# Patient Record
Sex: Female | Born: 1945 | Race: White | Hispanic: No | Smoking: Current every day smoker
Health system: Southern US, Community
[De-identification: ages and names within clinical notes are randomized; demographics above are authoritative.]

## PROBLEM LIST (undated history)

## (undated) DIAGNOSIS — K219 Gastro-esophageal reflux disease without esophagitis: Secondary | ICD-10-CM

## (undated) DIAGNOSIS — J449 Chronic obstructive pulmonary disease, unspecified: Secondary | ICD-10-CM

## (undated) DIAGNOSIS — F419 Anxiety disorder, unspecified: Secondary | ICD-10-CM

## (undated) DIAGNOSIS — R7303 Prediabetes: Secondary | ICD-10-CM

## (undated) DIAGNOSIS — E119 Type 2 diabetes mellitus without complications: Secondary | ICD-10-CM

## (undated) DIAGNOSIS — M797 Fibromyalgia: Secondary | ICD-10-CM

## (undated) DIAGNOSIS — M810 Age-related osteoporosis without current pathological fracture: Secondary | ICD-10-CM

## (undated) DIAGNOSIS — I10 Essential (primary) hypertension: Secondary | ICD-10-CM

## (undated) DIAGNOSIS — M1990 Unspecified osteoarthritis, unspecified site: Secondary | ICD-10-CM

## (undated) DIAGNOSIS — A630 Anogenital (venereal) warts: Secondary | ICD-10-CM

## (undated) DIAGNOSIS — E785 Hyperlipidemia, unspecified: Secondary | ICD-10-CM

## (undated) DIAGNOSIS — I2510 Atherosclerotic heart disease of native coronary artery without angina pectoris: Secondary | ICD-10-CM

## (undated) DIAGNOSIS — F4310 Post-traumatic stress disorder, unspecified: Secondary | ICD-10-CM

## (undated) HISTORY — DX: Atherosclerotic heart disease of native coronary artery without angina pectoris: I25.10

## (undated) HISTORY — DX: Anxiety disorder, unspecified: F41.9

## (undated) HISTORY — DX: Hyperlipidemia, unspecified: E78.5

## (undated) HISTORY — DX: Chronic obstructive pulmonary disease, unspecified: J44.9

## (undated) HISTORY — PX: INCISIONAL HERNIA REPAIR: SHX193

## (undated) HISTORY — DX: Essential (primary) hypertension: I10

## (undated) HISTORY — DX: Gastro-esophageal reflux disease without esophagitis: K21.9

## (undated) HISTORY — DX: Unspecified osteoarthritis, unspecified site: M19.90

## (undated) HISTORY — DX: Post-traumatic stress disorder, unspecified: F43.10

## (undated) HISTORY — PX: ABDOMINAL HYSTERECTOMY: SHX81

## (undated) HISTORY — DX: Prediabetes: R73.03

## (undated) HISTORY — PX: VASCULAR SURGERY: SHX849

## (undated) HISTORY — DX: Anogenital (venereal) warts: A63.0

## (undated) HISTORY — DX: Fibromyalgia: M79.7

## (undated) HISTORY — DX: Type 2 diabetes mellitus without complications: E11.9

## (undated) HISTORY — DX: Age-related osteoporosis without current pathological fracture: M81.0

---

## 1990-10-03 HISTORY — PX: PR VEIN BYPASS GRAFT,AORTO-FEM-POP: 35551

## 1999-08-09 ENCOUNTER — Encounter: Admission: RE | Admit: 1999-08-09 | Discharge: 1999-08-09 | Payer: Self-pay

## 1999-08-09 ENCOUNTER — Encounter: Payer: Self-pay | Admitting: General Surgery

## 2004-01-27 ENCOUNTER — Other Ambulatory Visit: Admission: RE | Admit: 2004-01-27 | Discharge: 2004-01-27 | Payer: Self-pay | Admitting: Obstetrics and Gynecology

## 2004-06-23 ENCOUNTER — Ambulatory Visit: Admission: RE | Admit: 2004-06-23 | Discharge: 2004-06-23 | Payer: Self-pay | Admitting: Gynecologic Oncology

## 2004-11-15 ENCOUNTER — Other Ambulatory Visit: Admission: RE | Admit: 2004-11-15 | Discharge: 2004-11-15 | Payer: Self-pay | Admitting: Obstetrics and Gynecology

## 2005-04-06 ENCOUNTER — Other Ambulatory Visit: Admission: RE | Admit: 2005-04-06 | Discharge: 2005-04-06 | Payer: Self-pay | Admitting: Obstetrics and Gynecology

## 2005-08-10 ENCOUNTER — Other Ambulatory Visit: Admission: RE | Admit: 2005-08-10 | Discharge: 2005-08-10 | Payer: Self-pay | Admitting: Obstetrics and Gynecology

## 2006-01-11 ENCOUNTER — Other Ambulatory Visit: Admission: RE | Admit: 2006-01-11 | Discharge: 2006-01-11 | Payer: Self-pay | Admitting: Obstetrics and Gynecology

## 2006-04-03 ENCOUNTER — Encounter: Payer: Self-pay | Admitting: Vascular Surgery

## 2006-04-03 ENCOUNTER — Ambulatory Visit (HOSPITAL_COMMUNITY): Admission: RE | Admit: 2006-04-03 | Discharge: 2006-04-03 | Payer: Self-pay | Admitting: *Deleted

## 2006-07-03 ENCOUNTER — Other Ambulatory Visit: Admission: RE | Admit: 2006-07-03 | Discharge: 2006-07-03 | Payer: Self-pay | Admitting: Obstetrics and Gynecology

## 2007-04-30 ENCOUNTER — Ambulatory Visit: Payer: Self-pay | Admitting: *Deleted

## 2007-06-21 ENCOUNTER — Ambulatory Visit: Payer: Self-pay | Admitting: *Deleted

## 2007-10-04 HISTORY — PX: RENAL ARTERY STENT: SHX2321

## 2007-10-04 HISTORY — PX: APPENDECTOMY: SHX54

## 2007-10-30 ENCOUNTER — Ambulatory Visit: Payer: Self-pay | Admitting: *Deleted

## 2007-12-20 ENCOUNTER — Ambulatory Visit: Payer: Self-pay | Admitting: *Deleted

## 2008-02-21 ENCOUNTER — Ambulatory Visit: Payer: Self-pay | Admitting: *Deleted

## 2008-03-05 ENCOUNTER — Ambulatory Visit (HOSPITAL_COMMUNITY): Admission: RE | Admit: 2008-03-05 | Discharge: 2008-03-05 | Payer: Self-pay | Admitting: *Deleted

## 2008-03-05 ENCOUNTER — Ambulatory Visit: Payer: Self-pay | Admitting: *Deleted

## 2008-05-01 ENCOUNTER — Ambulatory Visit: Payer: Self-pay | Admitting: *Deleted

## 2008-06-04 ENCOUNTER — Ambulatory Visit (HOSPITAL_COMMUNITY): Admission: RE | Admit: 2008-06-04 | Discharge: 2008-06-05 | Payer: Self-pay | Admitting: *Deleted

## 2008-06-04 ENCOUNTER — Ambulatory Visit: Payer: Self-pay | Admitting: *Deleted

## 2008-06-26 ENCOUNTER — Ambulatory Visit: Payer: Self-pay | Admitting: *Deleted

## 2008-08-14 ENCOUNTER — Ambulatory Visit: Payer: Self-pay | Admitting: *Deleted

## 2009-08-25 ENCOUNTER — Ambulatory Visit: Payer: Self-pay | Admitting: Vascular Surgery

## 2010-10-05 ENCOUNTER — Ambulatory Visit
Admission: RE | Admit: 2010-10-05 | Discharge: 2010-10-05 | Payer: Self-pay | Source: Home / Self Care | Attending: Vascular Surgery | Admitting: Vascular Surgery

## 2010-10-05 ENCOUNTER — Ambulatory Visit: Admit: 2010-10-05 | Payer: Self-pay | Admitting: Vascular Surgery

## 2010-10-26 ENCOUNTER — Ambulatory Visit
Admission: RE | Admit: 2010-10-26 | Discharge: 2010-10-26 | Payer: Self-pay | Source: Home / Self Care | Attending: Vascular Surgery | Admitting: Vascular Surgery

## 2010-10-27 NOTE — Assessment & Plan Note (Signed)
OFFICE VISIT  Lindsey, [name] DOB:  [birth year]                                       10/26/2010 [id number]  The patient returns today for further followup regarding her carotid occlusive disease and left renal artery stent.  She was last seen in November 2010 and she remains asymptomatic relative to carotid disease. She denies any paresthesias, aphasia, amaurosis fugax, diplopia, blurred vision, syncope or other neurologic symptoms.  Her blood pressure has been stable as has her kidney function according to her.  She has had a stent placed by Dr. Amedeo Plenty in 2009 in her left renal artery, aortobifemoral bypass by Dr. Scot Dock in 1992.  CHRONIC MEDICAL PROBLEMS: 1. Diabetes. 2. Hypertension. 3. Hyperlipidemia. 4. COPD. 5. Negative for coronary artery disease or stroke.  SOCIAL HISTORY:  She is single, disabled.  Smokes 1 to 1-1/2 pack cigarettes per day, does not use alcohol.  FAMILY HISTORY:  Positive for stroke and diabetes.  Negative coronary artery disease.  REVIEW OF SYSTEMS:  Positive for productive cough, bronchitis, asthma, wheezing, arthritis, joint pain, muscle pain, dyspnea on exertion, anxiety.  All other systems are negative on complete review of systems.  PHYSICAL EXAMINATION:  Vital signs:  Blood pressure 163/73, heart rate 64, respirations 20.  General:  She is a chronically ill-appearing female in no apparent distress, alert, oriented x3.  HEENT:  Normal for age.  EOMs intact.  Lungs:  Clear to auscultation.  Bilateral rhonchi present.  Cardiovascular:  Regular rhythm, no murmurs.  Carotid pulses 3+ with bilateral bruits.  Abdomen:  Soft, nontender with bruits in both bilateral upper abdomen.  Musculoskeletal:  Free of major deformities. Lower extremities:  Reveals 3+ femoral pulses bilaterally.  I ordered a carotid duplex exam and a renal scan.  She has no flow reduction in the left carotid and mild flow reduction in the  right carotid which is stable.  She does have visualization of her left renal artery today which was not seen clearly on her last study a year ago and the right renal artery has some mild narrowing in the midportion.  I reassured her regarding these findings.  We will see her back in 1 year for continued followup unless she develops new symptoms in the interim.    Lindsey [name] Lindsey [name], M.D. Electronically Signed  JDL/MEDQ  D:  10/26/2010  T:  10/27/2010  Job:  [id number]

## 2011-02-15 NOTE — Assessment & Plan Note (Signed)
OFFICE VISIT   Lindsey [name], Lindsey [name]  DOB:  1946/06/11                                       06/26/2008  [id number]   The patient underwent left renal arteriogram with PTA and stenting on  06/04/2008.  This was carried out via a right femoral approach.   She noted some swelling in her right groin and presented to the office  with this complaint.  She states this has now resolved.  No complaints  otherwise.   BP 161/71, pulse 76 per minute, respirations 18 per minute.  Abdomen  soft, nontender.  No masses or organomegaly.  Right groin incision is  unremarkable.   Doing well following left renal stenting.  Plan followup with carotid  duplex and renal stenting per protocol.   Dorothea Glassman, M.D.  Electronically Signed   PGH/MEDQ  D:  06/26/2008  T:  06/28/2008  Job:  [id number]

## 2011-02-15 NOTE — Assessment & Plan Note (Signed)
OFFICE VISIT   CYNNAMON, KENTER  DOB:  Jun 28, 1946                                       06/21/2007  [id number]   Patient return to the office today for continued surveillance of her  carotid disease.  She has remained asymptomatic.  Denies sensory, motor,  or visual deficit.  No speech or swallowing problems.  No gait  abnormality.   Her carotid Doppler evaluation reveals a 60-79% right ICA stenosis with  velocities of 201/37 cm/sec.  The left ICA reveals minimal disease.  Velocities 110/27 cm/sec.  She has biphasic brachial Doppler flow and  antegrade vertebral flow bilaterally.   She appears generally well.  Alert and oriented.  Cranial nerves are  intact.  Strength is equal bilaterally.  Heart sounds are normal without  murmurs.  Chest is clear with equal air entry bilaterally.  Right  carotid bruit audible.   Patient remains asymptomatic without significant change in carotid  artery disease over the past six months.  We will plan continued  surveillance in six months with repeat Doppler evaluation.   Dorothea Glassman, M.D.  Electronically Signed   PGH/MEDQ  D:  06/21/2007  T:  06/22/2007  Job:  [id number]   cc:   Charletta Cousin

## 2011-02-15 NOTE — Discharge Summary (Signed)
Lindsey [name], Lindsey [name]                ACCOUNT NO.:  [id number]   MEDICAL RECORD NO.:  [id number]          PATIENT TYPE:  OIB   LOCATION:  [id number]                         FACILITY:  [facility]   PHYSICIAN:  Dorothea Glassman, M.D.    DATE OF BIRTH:  Oct 23, 1945   DATE OF ADMISSION:  06/04/2008  DATE OF DISCHARGE:  06/05/2008                               DISCHARGE SUMMARY   DISCHARGE DIAGNOSIS:  Left renal arterial stenosis.   PROCEDURE PERFORMED:  Left renal arteriogram with placement of left  renal artery stent.   COMPLICATIONS:  None.   DISCHARGE MEDICATIONS:  She was instructed to resume all preoperative  medications consisting of:  1. Diovan 320 mg p.o. daily.  2. Zetia 10 mg p.o. daily.  3. Aspirin 325 mg p.o. daily.  4. Xanax 0.5 mg p.o. b.i.d.  5. Multivitamin with iron p.o. daily.  6. Crestor 10 mg p.o. daily.   DISPOSITION:  She is being discharged home in stable condition.  She is  to return to see Dr. Amedeo Plenty per his instructions.   BRIEF IDENTIFYING STATEMENT:  For complete details, please refer to  typed history and physical.  Briefly, this very pleasant 65 year old  woman with renal artery stenosis was evaluated by Dr. Amedeo Plenty.  He  recommended left renal arteriogram with placement of a left renal stent.  She was informed of the risks and benefits of the procedure and after  careful consideration, elected to proceed with surgery.   HOSPITAL COURSE:  She was brought in through the same-day surgery and  underwent the aforementioned left renal arteriogram with placement of  the left renal arterial stent.  For complete details, please refer the  typed operative report.  The procedure was without complications.  She  was returned to a surgical convalescent floor, and she was observed  overnight.  The following day, she was discharged home in stable  condition.      Chad Cordial, PA      P. Drucie Opitz, M.D.  Electronically Signed    KEL/MEDQ  D:  06/30/2008  T:   06/30/2008  Job:  [id number]

## 2011-02-15 NOTE — Procedures (Signed)
CAROTID DUPLEX EXAM   INDICATION:  Follow up known carotid artery disease.   HISTORY:  Diabetes:  No.  Cardiac:  COPD.  Hypertension:  Yes.  Smoking:  Yes.  Previous Surgery:  Left renal stent.  CV History:  Amaurosis Fugax No, Paresthesias No, Hemiparesis No.                                       RIGHT             LEFT  Brachial systolic pressure:  Brachial Doppler waveforms:  Vertebral direction of flow:        Antegrade         Antegrade  DUPLEX VELOCITIES (cm/sec)  CCA peak systolic                   120               0000000  ECA peak systolic                   186               0000000  ICA peak systolic                   236               [id number]  ICA end diastolic                   49                35  PLAQUE MORPHOLOGY:                  Mixed             Calcific  PLAQUE AMOUNT:                      Moderate          Mild  PLAQUE LOCATION:                    ICA               ICA   IMPRESSION:  1. 60-79% stenosis of the right internal carotid artery.  2. 40-59% stenosis of the left internal carotid (very low range, end      of range).  3. Antegrade flow in bilateral vertebrals.   ___________________________________________  Nelda Severe Kellie Simmering, M.D.   CB/MEDQ  D:  08/26/2009  T:  08/26/2009  Job:  [id number]

## 2011-02-15 NOTE — Assessment & Plan Note (Signed)
OFFICE VISIT   Lindsey [name], Lindsey [name]  DOB:  Sep 09, 1946                                       05/01/2008  [id number]   The patient has undergone an arteriogram which does reveal a severe left  renal artery stenosis, atrophy of the left kidney, a tubular stenosis of  the infrarenal aorta, patent aortobifemoral bypass and intact infrarenal  runoff.   Of concern is a severe stenosis of the left renal artery verified by  duplex scan.  She does have atrophy of the left kidney.   PHYSICAL EXAMINATION:  Her blood pressure is 161/73, pulse is 62 per  minute.  She is alert, oriented, no acute distress.  Abdomen:  Soft,  nontender.  Central abdominal bruit.  Intact femoral pulses bilaterally.   Due to the atrophy of her left kidney and associated severe stenosis, I  do think it would be in the patient's best interest to go ahead and  stent this.  I think this will be difficult to get to from the groin and  therefore will plan to access this through the left arm.  Scheduled for  05/21/2008 at [facility].   Dorothea Glassman, M.D.  Electronically Signed   PGH/MEDQ  D:  05/01/2008  T:  05/02/2008  Job:  [id number]   cc:   Clayborne Dana, MD

## 2011-02-15 NOTE — Assessment & Plan Note (Signed)
OFFICE VISIT   Lindsey [name], Lindsey [name]  DOB:  [birth year]                                       02/21/2008  [id number]   The patient was sent to the office today for an evaluation of abnormal  MR angiogram of the abdomen.  She has been complaining of some diffuse  or general moderate claudication symptoms bilaterally.  Also a history  of hypertension.  MR angiogram of the abdomen reveals infrarenal aortic  stenosis at the site of aortobi-iliac graft.  Also a severe left renal  artery stenosis with some atrophy of the left kidney.   She has a history remarkable for aortobi-iliac grafting with aortic  endarterectomy carried out in 1992 by Dr. Nicole Cella.  She does continue  to smoke a pack of cigarettes daily.   FURTHER MEDICAL HISTORY:  Includes COPD, osteoarthritis, hypertension,  hyperlipidemia, carotid stenosis, degenerative joint disease.   PAST SURGICAL HISTORY:  Surgical history includes aortobi-iliac bypass.  She has had an abdominal hysterectomy with ventral hernia repair with  mesh.   She continues to smoke one pack of cigarettes daily.   PHYSICAL EXAMINATION:  Vital signs:  Blood pressure is 151/68, pulse 67  per minute, respirations are 18 per minute.  Abdomen:  Abdomen is soft  and nontender.  No evidence of recurrent hernia.  Well-healed midline  incision.  No masses or organomegaly.  Vascular:  Femoral pulses are 1-  2+ bilaterally.  2+ right dorsalis pedis and 1+ left posterior tibial  pulse.   The patient shows evidence of severe left renal artery stenosis with  some atrophy of the kidney and also evidence of stenosis in her aortobi-  iliac graft.   We will plan to go ahead with diagnostic arteriography and possible  intervention if indicated, scheduled for June 3 at [facility].   Dorothea Glassman, M.D.  Electronically Signed   PGH/MEDQ  D:  02/21/2008  T:  02/22/2008  Job:  [id number]   cc:   Glendon Axe, Dr

## 2011-02-15 NOTE — Assessment & Plan Note (Signed)
OFFICE VISIT   Lindsey [name], Lindsey [name]  DOB:  [birth year]                                       08/25/2009  [id number]   The patient is a patient of Dr. Amedeo Plenty who returns today for annual  carotid occlusive disease followup.  She denies any symptoms of  hemiparesis, aphasia, amaurosis fugax, diplopia, blurred vision, syncope  or other neurologic problems.  She also states her blood pressure has  been stable having previously had a left renal stent placed by Dr. Amedeo Plenty  in 2009.  Her renal function apparently is stable as well although we do  not have any recent reports of that.  She denies any severe claudication  symptoms.   STABLE CHRONIC PROBLEMS:  1. Include diabetes mellitus type 2.  2. Hypertension.  3. Hyperlipidemia.  4. COPD.   SOCIAL HISTORY:  She is single, has no children, is retired.  Smokes 1  to 1-1/2 packs of cigarettes per day and has done so for 45+ years.  Does not use alcohol.   REVIEW OF SYSTEMS:  Positive for dyspnea on exertion and at rest,  occasional productive chronic bronchitis and productive cough, asthma,  wheezing, dizziness, arthritis, joint pain, anxiety.  All systems  negative.   PHYSICAL EXAMINATION:  Vital signs:  Blood pressure 155/78, heart rate  52, respirations 14, temperature 98.  General:  She is a female patient  who is alert and oriented x3.  She is thin and well-nourished.  Neck:  Carotid pulses are 3+, no bruits.  HEENT:  Exam unremarkable.  Neurological:  Normal.  Chest:  Clear to auscultation.  Cardiovascular:  Regular rhythm.  No murmurs.  Abdomen:  Soft, nontender.  There is a  bruit in the left upper quadrant.  She has 3+ femoral pulses bilaterally  with well-perfused lower extremities.   I ordered a carotid duplex exam as well as an abdominal renal scan  today.  She has a mild left internal carotid stenosis and a moderate  right internal carotid stenosis which are unchanged from previous  studies.   The right renal artery is widely patent.  Left renal artery  was not well visualized.   I am not certain if her left renal artery is patent or not but she seems  to be stable from a standpoint of renal function and blood pressure.  I  do hear a bruit in that area and possibly there is still flow getting to  the left renal artery but she does have good right renal artery flow.  Apparently it was a very difficult technical procedure when Dr. Amedeo Plenty  performed it so we will not pursue the left renal stenting any further.  She will return in 1 year for followup carotid duplex exam on the  protocol at that time.   Nelda Severe Kellie Simmering, M.D.  Electronically Signed   JDL/MEDQ  D:  08/25/2009  T:  08/26/2009  Job:  [id number]

## 2011-02-15 NOTE — Assessment & Plan Note (Signed)
OFFICE VISIT   DOVA, BACOTE  DOB:  04/08/1946                                       08/14/2008  [id number]   The patient returned to the office today with a carotid Doppler  evaluation.  She has known moderate bilateral internal carotid artery  stenosis.   Free of any symptoms, denies sensory, motor or visual deficit.   Carotid Doppler reveals mild to moderate bilateral carotid stenosis,  right greater than left, in the 40-59% range.  Antegrade vertebral flow  present bilaterally.   The patient appears generally well.  Right carotid bruit audible.  Cranial nerves intact.  Strength equal bilaterally.  Reflexes 2+.  BP is  160/70, pulse is 65 per minute.   Moderate asymptomatic bilateral carotid stenosis identified.  Continue  follow-up in 1 year with carotid Doppler.   Dorothea Glassman, M.D.  Electronically Signed   PGH/MEDQ  D:  08/14/2008  T:  08/15/2008  Job:  [id number]

## 2011-02-15 NOTE — Procedures (Signed)
RENAL ARTERY DUPLEX EVALUATION   INDICATION:  Follow up renal exam, left renal stent.   HISTORY:  Diabetes:  No.  Cardiac:  COPD.  Hypertension:  Yes.  Smoking:  Yes.   RENAL ARTERY DUPLEX FINDINGS:  Aorta-Proximal:  87 cm/s  Aorta-Mid:  529 cm/s  Aorta-Distal:  Not visualized  Celiac Artery Origin:  226 cm/s  SMA Origin:  214 cm/s                                    RIGHT               LEFT  Renal Artery Origin:             92 cm/s             Not visualized  Renal Artery Proximal:           221 cm/s            Not visualized  Renal Artery Mid:                                    Not visualized  Renal Artery Distal:             146 cm/s  Hilar Acceleration Time (AT):    0.89 m/s2           0.77 m/s2  Renal-Aortic Ratio (RAR):        2.5  Kidney Size:                     10.2 cm             8.9 cm  End Diastolic Ratio (EDR):  Resistive Index (RI):            0.80                0.74   IMPRESSION:  1. Increased velocities in the aorta.  2. Right renal artery <60% stenosis.  3. Left renal artery not visualized, attempted by 2 technicians.   ___________________________________________  Nelda Severe. Kellie Simmering, M.D.   CB/MEDQ  D:  08/26/2009  T:  08/26/2009  Job:  [id number]

## 2011-02-15 NOTE — Procedures (Signed)
CAROTID DUPLEX EXAM   INDICATION:  Follow up carotid artery disease.   HISTORY:  Diabetes:  No.  Cardiac:  COPD.  Hypertension:  Yes.  Smoking:  Yes.  Previous Surgery:  No carotid surgery.  CV History:  Currently asymptomatic.  Amaurosis Fugax No, Paresthesias No, Hemiparesis No                                       RIGHT             LEFT  Brachial systolic pressure:         142               144  Brachial Doppler waveforms:         Normal            Normal  Vertebral direction of flow:        Antegrade         Antegrade  DUPLEX VELOCITIES (cm/sec)  CCA peak systolic                   M=87/D=202        [id number]  ECA peak systolic                   172               97  ICA peak systolic                   140               76  ICA end diastolic                   32                19  PLAQUE MORPHOLOGY:                  Mixed             Mixed  PLAQUE AMOUNT:                      Moderate          Mild  PLAQUE LOCATION:                    ICA/ECA/CCA       ICA/ECA/CCA   IMPRESSION:  1. Doppler velocities suggest a 40-59% stenosis of the right proximal      to mid internal carotid artery with an increased velocity of the      right distal common carotid artery near the bifurcation noted.  2. No hemodynamically significant stenosis of the left internal      carotid artery with mild plaque formation as described above.  3. No significant change in Doppler velocities noted when compared to      the previous exam on 08/25/2009.   ___________________________________________  Nelda Severe. Kellie Simmering, M.D.   CH/MEDQ  D:  10/06/2010  T:  10/06/2010  Job:  [id number]

## 2011-02-15 NOTE — Op Note (Signed)
NAMEAASIYA, ROLLINGS                ACCOUNT NO.:  [id number]   MEDICAL RECORD NO.:  [id number]          PATIENT TYPE:  AMB   LOCATION:  SDS                          FACILITY:  [facility]   PHYSICIAN:  Dorothea Glassman, M.D.    DATE OF BIRTH:  [birth year]   DATE OF PROCEDURE:  03/05/2008  DATE OF DISCHARGE:                               OPERATIVE REPORT   DIAGNOSIS:  Bilateral lower extremity claudication.   PROCEDURE:  Abdominal aortogram with bilateral lower extremity runoff  arteriography.   ACCESS:  Right common femoral artery 5-French sheath.   CONTRAST:  137 mL Visipaque.   COMPLICATIONS:  None apparent.   CLINICAL NOTE:  Lindsey [name] is a 65 year old female who has a history  of aortobiiliac bypass carried out by Dr. Scot Dock in 1992.  She  continues to smoke a pack of cigarettes daily.  She has now developed  limiting claudication bilaterally in the lower extremities.  Evaluation  reveals evidence of stenosis of the aorta, proximal to the previous  bypass graft.  Brought to the cath lab at this time for further workup  with arteriography and possible percutaneous intervention.   PROCEDURE NOTE:  The patient brought to cath lab in stable condition.  Placed in supine position.  Both groins prepped and draped in sterile  fashion.   Skin and subcutaneous tissue, right groin instilled with 1% Xylocaine.  An 18-gauge needle induced in the right common femoral artery.  A 0.035  Magic Torque guidewire was advanced through the needle into the right  common iliac artery.  This was then advanced up into the aortobi-iliac  graft and into the abdominal aorta.  The needle removed and a 5-French  sheath advanced over guidewire.  The pigtail catheter advanced over the  guidewire to the juxtarenal aorta.   Standard AP abdominal aortogram obtained.  This revealed a single renal  arteries bilaterally.  Right renal artery was widely patent.  Left renal  artery did reveal a high-grade stenosis.   The left kidney noted to be  smaller than the right.  There was a long tubular stenosis of moderate  severity of the endarterectomized infrarenal aorta.  The aortobi-iliac  graft was widely patent.   Bilateral oblique pelvic images revealed the aortobiiliac graft and  anastomosis to be widely patent.  Retrograde flow in the external iliac  arteries reconstituted the hypogastric arteries bilaterally.  The  external iliac arteries were patent.  There was a moderate irregular  stenosis of the right common iliac artery.   Lower extremity runoff arteriography revealed patent superficial  femoral, profunda, popliteal, and tibial arteries bilaterally.   This completed the arteriogram procedure.  The guidewire reinserted,  pigtail catheter removed.  Right femoral sheath removed.  No apparent  complications.   IMPRESSION:  1. Severe left renal artery stenosis.  2. Small moderate tubular stenosis of the infrarenal aorta.  3. Patent aortobi-iliac bypass.  4. Right common femoral artery stenosis.  5. Intact infrainguinal arterial system bilaterally.   DISPOSITION:  Results will be reviewed further with the patient.  She  should likely undergo left  renal stenting for renal preservation due to  kidney atrophy.   Discussion regarding further management of her claudication symptoms to  follow.      Dorothea Glassman, M.D.  Electronically Signed     PGH/MEDQ  D:  03/05/2008  T:  03/06/2008  Job:  [id number]   cc:   Gardiner Rhyme, MD

## 2011-02-15 NOTE — Procedures (Signed)
CAROTID DUPLEX EXAM   INDICATION:  Follow up known carotid artery disease.   HISTORY:  Diabetes:  No.  Cardiac:  COPD.  Hypertension:  Yes.  Smoking:  Yes.  Previous Surgery:  Left renal stent.  CV History:  Amaurosis Fugax No, Paresthesias No, Hemiparesis No.                                       RIGHT             LEFT  Brachial systolic pressure:         150               150  Brachial Doppler waveforms:         Biphasic          Biphasic  Vertebral direction of flow:        Antegrade         Antegrade  DUPLEX VELOCITIES (cm/sec)  CCA peak systolic                   110               [id number]  ECA peak systolic                   169               [id number]  ICA peak systolic                   184               [id number]  ICA end diastolic                   44                36  PLAQUE MORPHOLOGY:                  Heterogenous      Heterogenous  PLAQUE AMOUNT:                      Moderate          Mild  PLAQUE LOCATION:                    ICA, ECA          ICA, ECA   IMPRESSION:  1. 40-59% stenosis noted in the bilateral internal carotid arteries,      right greater than left.  2. Antegrade bilateral vertebral arteries.       ___________________________________________  P. Drucie Opitz, M.D.   MG/MEDQ  D:  08/14/2008  T:  08/14/2008  Job:  [id number]

## 2011-02-15 NOTE — Op Note (Signed)
Lindsey [name], ECONOMY NO.:  [id number]   MEDICAL RECORD NO.:  [id number]          PATIENT TYPE:  OIB   LOCATION:  [id number]                         FACILITY:  [facility]   PHYSICIAN:  Dorothea Glassman, M.D.    DATE OF BIRTH:  Jul 24, 1946   DATE OF PROCEDURE:  06/04/2008  DATE OF DISCHARGE:                               OPERATIVE REPORT   DIAGNOSIS:  Left renal artery stenosis.   PROCEDURE:  1. Left renal arteriogram.  2. Left renal percutaneous transluminal angioplasty/stent.   ACCESS:  Right common femoral artery 6-French sheath.   CONTRAST:  75 mL Visipaque.   COMPLICATIONS:  None apparent.   CLINICAL NOTE:  Lindsey [name] is a 65 year old female with long history  of peripheral vascular disease.  She has previously undergone  aortobiiliac bypass approximately 15 years ago.  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  During recent workup, she was found to  have a severe left renal artery stenosis.  A small left kidney and  poorly controlled hypertension.  Brought to the cath lab at this time  for planned left renal arteriography with intervention, probable PTA and  stent.   PROCEDURE NOTE:  The patient was brought to cath lab in stable  condition.  Placed in a supine position.  Both groins were prepped and  draped in a sterile fashion.  Administered  2 mg of Versed intravenously  and 25 mcg of fentanyl.   Skin and subcutaneous tissue at right groins with 1% Xylocaine.  Needle  introduced in the right common femoral artery.  A 0.035 Wholey wire  advanced through the needle into the mid abdominal aorta.  The needle  removed and 6-French sheath advanced over guidewire, dilator removed  sheath flushed with heparin saline solution.   An IMA catheter was then advanced over the guidewire and engaged in the  left renal artery.  Selective left renal arteriography obtained.  This  revealed a high-grade  severe stenosis proximal left renal artery.   The left renal artery was then accessed with a 0.014 stabilizer  guidewire.  The IMA catheter removed and an IMA guide catheter was  advanced over the guidewire and engaged in the left renal artery origin.  The patient administered 5000 units heparin intravenously.  ACT measured  269 seconds.   A Genesis 5 x 15 stent was chosen.  The stent advanced over the  guidewire, however, could not be passed across the origin of left renal  artery at the area of stenosis.  Stent was therefore removed and a 4 x  15 Viatrac balloon was then advanced over the guidewire and inflated at  4 atmospheres x15 seconds and 5 atmospheres x15 seconds.  This balloon  then removed, following predilatation the 5 x 15 Genesis stent was then  re-advanced over the guidewire and positioned at the left renal artery  origin.  With pulse contrast injection, the stent was positioned  appropriately across the origin of left renal artery and dilated at 10  atmospheres x15 seconds.  A dilatation across the origin was also  carried out __________ atmospheres x15 seconds.   The balloon then removed.  A completion arteriogram obtained.  This  revealed no evidence of complicating features.  Excellent technical  result, no residual stenosis.   The guidewire then removed along with the guide catheter.  The right  femoral sheath left in place appropriately for reduction of ACT.   FINAL IMPRESSION:  1. Severe left renal artery stenosis.  2. Successful percutaneous transluminal angioplasty (PTA) and stent      with no residual stenosis left renal artery.       Dorothea Glassman, M.D.  Electronically Signed     PGH/MEDQ  D:  06/04/2008  T:  06/05/2008  Job:  [id number]   cc:   Gardiner Rhyme, MD

## 2011-02-15 NOTE — Procedures (Signed)
RENAL ARTERY DUPLEX EVALUATION   INDICATION:  Follow up left renal artery stenosis per aortogram.   HISTORY:  Diabetes:  No.  Cardiac:  COPD.  Hypertension:  Yes.  Smoking:  Yes.   RENAL ARTERY DUPLEX FINDINGS:  Aorta-Proximal:  109 cm/s  Aorta-Mid:  122 cm/s  Aorta-Distal:  178 cm/s  Celiac Artery Origin:  346 cm/s  SMA Origin:  114 cm/s                                    RIGHT               LEFT  Renal Artery Origin:             156 cm/s            501 cm/s  Renal Artery Proximal:           175 cm/s            346 cm/s  Renal Artery Mid:                195 cm/s            209 cm/s  Renal Artery Distal:             156 cm/s            54 cm/s  Hilar Acceleration Time (AT):  Renal-Aortic Ratio (RAR):        1.7                 4.5  Kidney Size:                     10.95 cm            0000000 cm  End Diastolic Ratio (EDR):  Resistive Index (RI):            0.76                0.71   IMPRESSION:  1. A greater than 60% stenosis noted in the left renal artery.  2. Less than 60% stenosis noted in the right renal artery.  3. Left kidney appears slightly atrophied, measuring 9.11 cm.  The      right kidney measured within normal limits (10.95 cm).   Incidental findings:  Gallstones noted in the gallbladder.   ___________________________________________  P. Drucie Opitz, M.D.   MG/MEDQ  D:  05/01/2008  T:  05/01/2008  Job:  [id number]

## 2011-02-15 NOTE — Procedures (Signed)
RENAL ARTERY DUPLEX EVALUATION   INDICATION:  Follow up left renal artery stent.   HISTORY:  Diabetes:  No  Cardiac:  COPD  Hypertension:  Yes  Smoking:  Yes   RENAL ARTERY DUPLEX FINDINGS:  Aorta-Proximal:  127 cm/s  Aorta-Mid:  141 cm/s  Aorta-Distal:  404 cm/s  Celiac Artery Origin:  212/42 cm/s  SMA Origin:  209/17 cm/s                                    RIGHT               LEFT  Renal Artery Origin:             158 cm/s            not visualized  Renal Artery Proximal:           151 cm/s            191 cm/s  Renal Artery Mid:                263 cm/s            115 cm/s  Renal Artery Distal:             161 cm/s            56 cm/s  Hilar Acceleration Time (AT):  Renal-Aortic Ratio (RAR):        1.9                 1.4  Kidney Size:                     11.0 cm             9.1 cm  End Diastolic Ratio (EDR):  Resistive Index (RI):            0.80                0.83   IMPRESSION:  1. Doppler velocities and renal/aortic ratios suggest no      hemodynamically significant stenosis of the bilateral renal      arteries with a velocity of 263 cm/s noted at an area of possible      vessel kinking in the mid right renal artery.  2. The bilateral kidney length measurements are within normal limits;      however, the left kidney does measure smaller than the right.  3. Abnormal bilateral intrarenal resistive indices noted.  4. Elevated velocity of 404 cm/s noted in the distal abdominal aorta      with patent bilateral aortobifemoral bypass graft limbs noted.  5. No significant change in Doppler velocities noted when compared to      the previous exam on 08/25/2009.   ___________________________________________  Nelda Severe. Kellie Simmering, M.D.   CH/MEDQ  D:  10/06/2010  T:  10/06/2010  Job:  [id number]

## 2011-02-15 NOTE — Procedures (Signed)
CAROTID DUPLEX EXAM   INDICATION:  Follow up known carotid artery disease.   HISTORY:  Diabetes:  No.  Cardiac:  COPD.  Hypertension:  Yes.  Smoking:  Yes.  Previous Surgery:  CV History:  Amaurosis Fugax No, Paresthesias No, Hemiparesis No                                       RIGHT             LEFT  Brachial systolic pressure:         140               140  Brachial Doppler waveforms:         Biphasic          Biphasic  Vertebral direction of flow:        Antegrade         Antegrade  DUPLEX VELOCITIES (cm/sec)  CCA peak systolic                   90                [id number]  ECA peak systolic                   316               [id number]  ICA peak systolic                   201               [id number]  ICA end diastolic                   37                27  PLAQUE MORPHOLOGY:                  Calcified         Calcified  PLAQUE AMOUNT:                      Moderate          Mild  PLAQUE LOCATION:                    ICA, ECA          ICA, ECA   IMPRESSION:  1. A 60-79% stenosis noted in the right internal carotid artery.  2. A 20-39% stenosis noted on the left internal carotid artery.  3. Antegrade bilateral vertebral arteries.   ___________________________________________  P. Drucie Opitz, M.D.   MG/MEDQ  D:  06/21/2007  T:  06/21/2007  Job:  [id number]

## 2011-02-15 NOTE — Procedures (Signed)
CAROTID DUPLEX EXAM   INDICATION:  Follow up carotid artery disease.   HISTORY:  Diabetes:  No.  Cardiac:  COPD.  Hypertension:  Yes.  Smoking:  Yes.  Previous Surgery:  No.  CV History:  No.  Amaurosis Fugax No, Paresthesias No, Hemiparesis No                                       RIGHT             LEFT  Brachial systolic pressure:         154               150  Brachial Doppler waveforms:         Biphasic          Biphasic  Vertebral direction of flow:        Antegrade         Antegrade  DUPLEX VELOCITIES (cm/sec)  CCA peak systolic                   127               [id number]  ECA peak systolic                   384               [id number]  ICA peak systolic                   240               0000000  ICA end diastolic                   65                36  PLAQUE MORPHOLOGY:                  Calcified         Calcified  PLAQUE AMOUNT:                      Moderate          Mild  PLAQUE LOCATION:                    ICA/ECA           ICA/CCA   IMPRESSION:  1. Right ICA shows evidence of 60-79% stenosis.  2. The left ICA shows evidence of 40-59% stenosis (low end of range).  3. Right ECA stenosis.  4. Fairly stable study, showing slight velocity increase bilaterally.   ___________________________________________  P. Drucie Opitz, M.D.   AS/MEDQ  D:  12/20/2007  T:  12/20/2007  Job:  [id number]

## 2011-02-18 NOTE — Consult Note (Signed)
NAMEJOSANNE, Lindsey [name] NO.:  [id number]   MEDICAL RECORD NO.:  [id number]          PATIENT TYPE:  OUT   LOCATION:  GYN                          FACILITY:  [facility]   PHYSICIAN:  John T. Clarene Essex, M.D.    DATE OF BIRTH:  1945-10-27   DATE OF CONSULTATION:  DATE OF DISCHARGE:                                   CONSULTATION   CHIEF COMPLAINT:  65 year old seen at the request Dr. Sander Radon for  recommendations regarding management of VAIN-3.   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  Prior to that, she had had  intermittently abnormal Pap smears.  She had low grade SIL Pap smear with  colposcopic directed biopsies in July 2004 negative.  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  She underwent ball cautery of  the vagina on May 25, 2004.  She has been convalescent from this.  She  has mild vaginal drainage and has discontinued vaginal estrogen.   PAST MEDICAL HISTORY:  The patient has chronic anxiety.  She is status post  hysterectomy.   MEDICATIONS:  Vytorin, Diovan, Premarin, and Xanax.   ALLERGIES:  Sulfa drugs.   FAMILY HISTORY:  Noncontributory.   PERSONAL/SOCIAL HISTORY:  Single and not sexually active.  She admits 1 1/2  to 2 packs cigarettes per day.  Ethanol denies.   REVIEW OF SYMPTOMS:  Negative, otherwise.   PHYSICAL EXAMINATION:  VITAL SIGNS:  Weight 122 pounds, blood pressure 140/64, pulse 72,  temperature afebrile.  GENERAL:  Chronically ill appearing woman in no acute distress.  NECK:  Supple without goiter or adenopathy.  ABDOMEN:  Soft, nontender, well healed incision without hernia.  There is no  ascites, mass, or tenderness.  EXTREMITIES:  No edema.  PELVIC:  External genitalia and BUS normal to inspection and palpation  without condylomatous lesions.  The vagina  has modest estrogen affect with  good supportive bladder and urethra.  There is a healing cautery site in the  left lateral vagina which has not completely epithelialized.  No gross  lesions otherwise.  Absent uterus and cervix.  Bimanual and rectovaginal  examinations reveal no mass or nodularity.   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   RECOMMENDATIONS:  The patient is at high risk for recurrence because of her  smoking status.  I had a long discussion regarding the options for  management including simple monitoring with cytology.  Because of her high  risk status, I recommended intermittent continuous Efudex.  The patient is  given a prescription to use 1 gram per vagina q. Monday, Wednesday, and  Friday for a total of three months.  She was given specific written and  verbal  instructions regarding the use of Efudex and indications for modifying the  dose or decreasing the dosage.  One month after she has completed Efudex,  she should have a follow up Pap smear  and this and subsequent follow up  could be performed by Dr. Theda Sers.  We would be glad to see her back at any  time on a p.r.n. basis.     John   JTS/MEDQ  D:  06/23/2004  T:  06/24/2004  Job:  [id number]   cc:   Sander Radon, M.D.  [address]  [place]  [place] [address]  Fax: [phone]   Caswell Corwin, R.N.  [phone], [place] [address]

## 2011-06-30 LAB — PROTIME-INR
INR: 0.9
Prothrombin Time: 12.1

## 2011-06-30 LAB — CBC
HCT: 42.1
Hemoglobin: 14.6
MCHC: 34.7
MCV: 91.3
RBC: 4.61
RDW: 12.3

## 2011-06-30 LAB — COMPREHENSIVE METABOLIC PANEL
ALT: <8
Alkaline Phosphatase: 114
BUN: 27 — ABNORMAL HIGH
CO2: 24
Calcium: 9.9
GFR calc non Af Amer: 50 — ABNORMAL LOW
Glucose, Bld: 121 — ABNORMAL HIGH
Sodium: 140

## 2011-07-06 LAB — CBC
HCT: 38
Hemoglobin: 12.9
Platelets: 274
Platelets: 315
RBC: 4.02
RDW: 12.5
WBC: 8.4

## 2011-07-06 LAB — POCT I-STAT, CHEM 8
Creatinine, Ser: 1.3 — ABNORMAL HIGH
Glucose, Bld: 113 — ABNORMAL HIGH
HCT: 38
Hemoglobin: 12.9
Sodium: 138
TCO2: 25

## 2011-07-06 LAB — BASIC METABOLIC PANEL
BUN: 15
Calcium: 9.5
GFR calc Af Amer: >60
GFR calc non Af Amer: 50 — ABNORMAL LOW
GFR calc non Af Amer: 54 — ABNORMAL LOW
Glucose, Bld: 93
Potassium: 4
Sodium: 139

## 2011-11-09 DIAGNOSIS — F332 Major depressive disorder, recurrent severe without psychotic features: Secondary | ICD-10-CM | POA: Diagnosis not present

## 2011-11-14 DIAGNOSIS — R7301 Impaired fasting glucose: Secondary | ICD-10-CM | POA: Diagnosis not present

## 2011-11-14 DIAGNOSIS — I10 Essential (primary) hypertension: Secondary | ICD-10-CM | POA: Diagnosis not present

## 2011-11-14 DIAGNOSIS — J449 Chronic obstructive pulmonary disease, unspecified: Secondary | ICD-10-CM | POA: Diagnosis not present

## 2011-11-14 DIAGNOSIS — J309 Allergic rhinitis, unspecified: Secondary | ICD-10-CM | POA: Diagnosis not present

## 2011-11-14 DIAGNOSIS — E785 Hyperlipidemia, unspecified: Secondary | ICD-10-CM | POA: Diagnosis not present

## 2011-11-14 DIAGNOSIS — R300 Dysuria: Secondary | ICD-10-CM | POA: Diagnosis not present

## 2012-02-20 ENCOUNTER — Encounter: Payer: Self-pay | Admitting: Vascular Surgery

## 2012-03-06 ENCOUNTER — Ambulatory Visit (INDEPENDENT_AMBULATORY_CARE_PROVIDER_SITE_OTHER): Payer: Medicare Other | Admitting: *Deleted

## 2012-03-06 ENCOUNTER — Other Ambulatory Visit: Payer: Self-pay

## 2012-03-06 ENCOUNTER — Ambulatory Visit (INDEPENDENT_AMBULATORY_CARE_PROVIDER_SITE_OTHER): Payer: Medicare Other | Admitting: Neurosurgery

## 2012-03-06 ENCOUNTER — Other Ambulatory Visit (INDEPENDENT_AMBULATORY_CARE_PROVIDER_SITE_OTHER): Payer: Medicare Other | Admitting: *Deleted

## 2012-03-06 ENCOUNTER — Ambulatory Visit: Payer: Self-pay | Admitting: Vascular Surgery

## 2012-03-06 ENCOUNTER — Encounter: Payer: Self-pay | Admitting: Neurosurgery

## 2012-03-06 VITALS — BP 147/60 | HR 60 | Resp 17 | Ht 62.0 in | Wt 150.5 lb

## 2012-03-06 DIAGNOSIS — I7092 Chronic total occlusion of artery of the extremities: Secondary | ICD-10-CM

## 2012-03-06 DIAGNOSIS — Z48812 Encounter for surgical aftercare following surgery on the circulatory system: Secondary | ICD-10-CM

## 2012-03-06 DIAGNOSIS — I701 Atherosclerosis of renal artery: Secondary | ICD-10-CM | POA: Insufficient documentation

## 2012-03-06 DIAGNOSIS — I6529 Occlusion and stenosis of unspecified carotid artery: Secondary | ICD-10-CM | POA: Diagnosis not present

## 2012-03-06 NOTE — Progress Notes (Signed)
[facility] HISTORY AND PHYSICAL   CC: Annual renal artery duplex and carotid duplex Referring Physician: Kellie Simmering  History of Present Illness: 66 year old female patient of Dr. Kellie Simmering followed for a history of left renal artery stent in 2009, she's also had a aortobiiliac graft in 1992 with no carotid intervention to date. The patient denies any back or abdominal pain other than some right-sided radiculopathy she attributes to lumbar stenosis. The patient also denies any new medical problems or recent surgery. She's had no signs or symptoms of CVA, TIA, amaurosis fugax or any neural deficit.  Past Medical History  Diagnosis Date  . Prediabetes   . Hypertension   . Hyperlipidemia   . COPD (chronic obstructive pulmonary disease)   . Fibromyalgia   . Asthma   . Arthritis   . Anxiety     ROS: [x]  Positive   [ ]  Denies    General: [ ]  Weight loss, [ ]  Fever, [ ]  chills Neurologic: [ ]  Dizziness, [ ]  Blackouts, [ ]  Seizure [ ]  Stroke, [ ]  "Mini stroke", [ ]  Slurred speech, [ ]  Temporary blindness; [ ]  weakness in arms or legs, [ ]  Hoarseness Cardiac: [ ]  Chest pain/pressure, [ ]  Shortness of breath at rest [ ]  Shortness of breath with exertion, [ ]  Atrial fibrillation or irregular heartbeat Vascular: [ ]  Pain in legs with walking, [ ]  Pain in legs at rest, [ ]  Pain in legs at night,  [ ]  Non-healing ulcer, [ ]  Blood clot in vein/DVT,   Pulmonary: [ ]  Home oxygen, [ ]  Productive cough, [ ]  Coughing up blood, [ ]  Asthma,  [ ]  Wheezing Musculoskeletal:  [ ]  Arthritis, [ ]  Low back pain, [ ]  Joint pain Hematologic: [ ]  Easy Bruising, [ ]  Anemia; [ ]  Hepatitis Gastrointestinal: [ ]  Blood in stool, [ ]  Gastroesophageal Reflux/heartburn, [ ]  Trouble swallowing Urinary: [ ]  chronic Kidney disease, [ ]  on HD - [ ]  MWF or [ ]  TTHS, [ ]  Burning with urination, [ ]  Difficulty urinating Skin: [ ]  Rashes, [ ]  Wounds Psychological: [ ]  Anxiety, [ ]  Depression   Social  History History  Substance Use Topics  . Smoking status: Current Everyday Smoker -- 1.0 packs/day  . Smokeless tobacco: Not on file  . Alcohol Use:     Family History Family History  Problem Relation Age of Onset  . Diabetes Other   . Stroke Other     Allergies  Allergen Reactions  . Advair Diskus (Fluticasone-Salmeterol)   . Ambien (Zolpidem)   . Amoxicillin   . Augmentin (Amoxicillin-Pot Clavulanate)   . Biaxin (Clarithromycin)   . Codeine   . Combivent (Ipratropium-Albuterol)   . Decongestant-Antihistamine (Triprolidine-Pse)   . Ephedrine   . Flagyl (Metronidazole)   . Flexeril (Cyclobenzaprine)   . Flonase (Fluticasone Propionate)   . Hctz (Hydrochlorothiazide)     Fainting  . Lotrel (Amlodipine Besy-Benazepril Hcl)   . Macrobid (Nitrofurantoin)   . Meclizine   . Nasonex (Mometasone Furoate)   . Niaspan (Niacin)     Flushing, sweating  . Norvasc (Amlodipine Besylate)   . Paxil (Paroxetine Hcl)   . Phenergan (Promethazine Hcl)   . Prednisone   . Premarin (Conjugated Estrogens)   . Red Dye     Headache, irritable, weakness  . Relafen (Nabumetone)   . Spiriva (Tiotropium Bromide Monohydrate)   . Sulfa Antibiotics     Edema, N&V  . Tequin (Gatifloxacin)   . Tramadol   .  Transderm-Scop (Scopolamine)     Current Outpatient Prescriptions  Medication Sig Dispense Refill  . ALPRAZolam (XANAX) 0.5 MG tablet Take 0.5 mg by mouth 3 (three) times daily as needed.      Marland Kitchen aspirin 325 MG tablet Take 325 mg by mouth daily.      Marland Kitchen ezetimibe (ZETIA) 10 MG tablet Take 10 mg by mouth daily.      . Multiple Vitamin (MULTIVITAMIN) tablet Take 1 tablet by mouth daily.      Marland Kitchen omeprazole (PRILOSEC) 20 MG capsule Take 20 mg by mouth daily.      . rosuvastatin (CRESTOR) 10 MG tablet Take 10 mg by mouth daily.      . valsartan (DIOVAN) 80 MG tablet Take 80 mg by mouth daily.        Physical Examination  Filed Vitals:   03/06/12 1507  BP: 147/60  Pulse:   Resp:     Body  mass index is 27.53 kg/(m^2).  General:  WDWN in NAD Gait: Normal HEENT: WNL Eyes: Pupils equal Pulmonary: normal non-labored breathing , without Rales, rhonchi,  wheezing Cardiac: RRR, without  Murmurs, rubs or gallops; Abdomen: soft, NT, no masses Skin: no rashes, ulcers noted  Vascular Exam Pulses: 2+ radial pulses bilaterally, palpable femoral pulses bilaterally Carotid bruits: Carotid pulses to auscultation bilaterally no bruits are heard Extremities without ischemic changes, no Gangrene , no cellulitis; no open wounds;  Musculoskeletal: no muscle wasting or atrophy   Neurologic: A&O X 3; Appropriate Affect ; SENSATION: normal; MOTOR FUNCTION:  moving all extremities equally. Speech is fluent/normal  Non-Invasive Vascular Imaging CAROTID DUPLEX 03/06/2012  Right ICA 40 - 59 % stenosis Left ICA 20 - 39 % stenosis Renal artery duplex shows aorta proximal at 117 cm a second, mid 130, distal 414 which is essentially unchanged from previous, renal artery mid is 140, renal artery distal is 262  ASSESSMENT/PLAN: Asymptomatic stable patient with history of renal artery stent in 2009, asymptomatic from a carotid standpoint. The patient return in one year for repeat renal artery duplex as well as carotid duplex. Her questions were encouraged and answered, she is in agreement with this plan.  Beatris Ship ANP   Clinic MD: Kellie Simmering

## 2012-03-07 DIAGNOSIS — E119 Type 2 diabetes mellitus without complications: Secondary | ICD-10-CM | POA: Diagnosis not present

## 2012-03-12 NOTE — Procedures (Unsigned)
CAROTID DUPLEX EXAM  INDICATION:  Followup carotid disease  HISTORY: Diabetes:  No Cardiac:  No Hypertension:  Yes Smoking:  Yes Previous Surgery:  Aortobi-iliac graft; left renal artery stent CV History: Amaurosis Fugax No, Paresthesias No, Hemiparesis No                                      RIGHT             LEFT Brachial systolic pressure:         154               147 Brachial Doppler waveforms:         WNL               WNL Vertebral direction of flow:        Antegrade         Antegrade DUPLEX VELOCITIES (cm/sec) CCA peak systolic                   101               85 ECA peak systolic                   383               [id number] ICA peak systolic                   126               [id number] ICA end diastolic                   39                29 PLAQUE MORPHOLOGY:                  Heterogeneous     Heterogeneous PLAQUE AMOUNT:                      Moderate          Mild PLAQUE LOCATION:                    CCA/ECA/ICA       CCA/ICA  IMPRESSION: 1. 40%-59% (borderline) right internal carotid artery stenosis. 2. 1%-39% left internal carotid artery stenosis. 3. Severe stenosis of the right external carotid artery. 4. Bilateral vertebral arteries are within normal limits.  ___________________________________________ Nelda Severe Kellie Simmering, M.D.  LT/MEDQ  D:  03/06/2012  T:  03/06/2012  Job:  [id number]

## 2012-03-12 NOTE — Procedures (Unsigned)
RENAL ARTERY DUPLEX EVALUATION  INDICATION:  Follow up left renal artery stent placed 06/04/2008. History, aortobiiliac graft in 1992.  HISTORY: Diabetes:  No. Cardiac:  No. Hypertension:  Yes. Smoking:  Yes.  RENAL ARTERY DUPLEX FINDINGS: Aorta-Proximal:  117 cm/s Aorta-Mid:  130 cm/s Aorta-Distal:  414 cm/s Celiac Artery Origin:  478 cm/s SMA Origin:  152 cm/s                                   RIGHT               LEFT Renal Artery Origin:                                 NV Renal Artery Proximal:                               NV Renal Artery Mid:                                    140 cm/s Renal Artery Distal:                                 262 cm/s Hilar Acceleration Time (AT): Renal-Aortic Ratio (RAR):                            Invalid Kidney Size:                                         0000000 cm End Diastolic Ratio (EDR): Resistive Index (RI):                                0.81/0.80  IMPRESSION:  A technically difficult study due to multiple abdominal procedures, prior aortobifemoral graft, tortuous renal artery. 1. Stable velocities of known aortic stenosis proximal to the     aortobiiliac graft. 2. Left renal artery stent could not be visualized in 2D; however,     color-flow imaging suggests patency.  Waveforms appear turbulent;     however, this may be reflected from aortic stenosis. 3. Elevated velocities are observed in the celiac access.  ___________________________________________ Nelda Severe Kellie Simmering, M.D.  LT/MEDQ  D:  03/06/2012  T:  03/06/2012  Job:  [id number]

## 2012-04-24 DIAGNOSIS — I10 Essential (primary) hypertension: Secondary | ICD-10-CM | POA: Diagnosis not present

## 2012-04-24 DIAGNOSIS — M5137 Other intervertebral disc degeneration, lumbosacral region: Secondary | ICD-10-CM | POA: Diagnosis not present

## 2012-04-24 DIAGNOSIS — R05 Cough: Secondary | ICD-10-CM | POA: Diagnosis not present

## 2012-04-24 DIAGNOSIS — R7301 Impaired fasting glucose: Secondary | ICD-10-CM | POA: Diagnosis not present

## 2012-04-24 DIAGNOSIS — J019 Acute sinusitis, unspecified: Secondary | ICD-10-CM | POA: Diagnosis not present

## 2012-04-24 DIAGNOSIS — E039 Hypothyroidism, unspecified: Secondary | ICD-10-CM | POA: Diagnosis not present

## 2012-04-24 DIAGNOSIS — E785 Hyperlipidemia, unspecified: Secondary | ICD-10-CM | POA: Diagnosis not present

## 2012-05-09 DIAGNOSIS — F332 Major depressive disorder, recurrent severe without psychotic features: Secondary | ICD-10-CM | POA: Diagnosis not present

## 2012-06-14 DIAGNOSIS — M949 Disorder of cartilage, unspecified: Secondary | ICD-10-CM | POA: Diagnosis not present

## 2012-07-04 DIAGNOSIS — M899 Disorder of bone, unspecified: Secondary | ICD-10-CM | POA: Diagnosis not present

## 2012-07-04 DIAGNOSIS — M949 Disorder of cartilage, unspecified: Secondary | ICD-10-CM | POA: Diagnosis not present

## 2012-07-04 DIAGNOSIS — I10 Essential (primary) hypertension: Secondary | ICD-10-CM | POA: Diagnosis not present

## 2012-07-04 DIAGNOSIS — R21 Rash and other nonspecific skin eruption: Secondary | ICD-10-CM | POA: Diagnosis not present

## 2012-07-04 DIAGNOSIS — E663 Overweight: Secondary | ICD-10-CM | POA: Diagnosis not present

## 2012-08-02 DIAGNOSIS — L509 Urticaria, unspecified: Secondary | ICD-10-CM | POA: Diagnosis not present

## 2012-08-20 DIAGNOSIS — L259 Unspecified contact dermatitis, unspecified cause: Secondary | ICD-10-CM | POA: Diagnosis not present

## 2012-08-20 DIAGNOSIS — T887XXA Unspecified adverse effect of drug or medicament, initial encounter: Secondary | ICD-10-CM | POA: Diagnosis not present

## 2012-08-20 DIAGNOSIS — E069 Thyroiditis, unspecified: Secondary | ICD-10-CM | POA: Diagnosis not present

## 2012-08-20 DIAGNOSIS — R1013 Epigastric pain: Secondary | ICD-10-CM | POA: Diagnosis not present

## 2012-08-20 DIAGNOSIS — L500 Allergic urticaria: Secondary | ICD-10-CM | POA: Diagnosis not present

## 2012-08-20 DIAGNOSIS — R5383 Other fatigue: Secondary | ICD-10-CM | POA: Diagnosis not present

## 2012-09-03 DIAGNOSIS — L259 Unspecified contact dermatitis, unspecified cause: Secondary | ICD-10-CM | POA: Diagnosis not present

## 2012-09-12 DIAGNOSIS — L538 Other specified erythematous conditions: Secondary | ICD-10-CM | POA: Diagnosis not present

## 2012-11-07 DIAGNOSIS — F332 Major depressive disorder, recurrent severe without psychotic features: Secondary | ICD-10-CM | POA: Diagnosis not present

## 2013-02-20 DIAGNOSIS — I10 Essential (primary) hypertension: Secondary | ICD-10-CM | POA: Diagnosis not present

## 2013-02-20 DIAGNOSIS — E785 Hyperlipidemia, unspecified: Secondary | ICD-10-CM | POA: Diagnosis not present

## 2013-02-20 DIAGNOSIS — F17200 Nicotine dependence, unspecified, uncomplicated: Secondary | ICD-10-CM | POA: Diagnosis not present

## 2013-02-20 DIAGNOSIS — E119 Type 2 diabetes mellitus without complications: Secondary | ICD-10-CM | POA: Diagnosis not present

## 2013-03-01 ENCOUNTER — Other Ambulatory Visit: Payer: Self-pay | Admitting: *Deleted

## 2013-03-01 DIAGNOSIS — I701 Atherosclerosis of renal artery: Secondary | ICD-10-CM

## 2013-03-01 DIAGNOSIS — Z48812 Encounter for surgical aftercare following surgery on the circulatory system: Secondary | ICD-10-CM

## 2013-03-06 DIAGNOSIS — I798 Other disorders of arteries, arterioles and capillaries in diseases classified elsewhere: Secondary | ICD-10-CM | POA: Diagnosis not present

## 2013-03-06 DIAGNOSIS — E782 Mixed hyperlipidemia: Secondary | ICD-10-CM | POA: Diagnosis not present

## 2013-03-06 DIAGNOSIS — E1159 Type 2 diabetes mellitus with other circulatory complications: Secondary | ICD-10-CM | POA: Diagnosis not present

## 2013-03-07 ENCOUNTER — Other Ambulatory Visit (INDEPENDENT_AMBULATORY_CARE_PROVIDER_SITE_OTHER): Payer: Medicare Other | Admitting: *Deleted

## 2013-03-07 ENCOUNTER — Other Ambulatory Visit: Payer: Medicare Other

## 2013-03-07 ENCOUNTER — Ambulatory Visit: Payer: Medicare Other | Admitting: Neurosurgery

## 2013-03-07 DIAGNOSIS — I6529 Occlusion and stenosis of unspecified carotid artery: Secondary | ICD-10-CM

## 2013-03-07 DIAGNOSIS — Z48812 Encounter for surgical aftercare following surgery on the circulatory system: Secondary | ICD-10-CM | POA: Diagnosis not present

## 2013-03-07 DIAGNOSIS — I7092 Chronic total occlusion of artery of the extremities: Secondary | ICD-10-CM | POA: Diagnosis not present

## 2013-03-07 DIAGNOSIS — I701 Atherosclerosis of renal artery: Secondary | ICD-10-CM

## 2013-03-08 ENCOUNTER — Other Ambulatory Visit: Payer: Self-pay

## 2013-03-11 DIAGNOSIS — E119 Type 2 diabetes mellitus without complications: Secondary | ICD-10-CM | POA: Diagnosis not present

## 2013-03-12 ENCOUNTER — Encounter: Payer: Self-pay | Admitting: Vascular Surgery

## 2013-03-20 ENCOUNTER — Other Ambulatory Visit: Payer: Self-pay

## 2013-03-20 DIAGNOSIS — Z48812 Encounter for surgical aftercare following surgery on the circulatory system: Secondary | ICD-10-CM

## 2013-03-21 ENCOUNTER — Other Ambulatory Visit: Payer: Self-pay

## 2013-03-21 DIAGNOSIS — I701 Atherosclerosis of renal artery: Secondary | ICD-10-CM

## 2013-03-21 DIAGNOSIS — Z48812 Encounter for surgical aftercare following surgery on the circulatory system: Secondary | ICD-10-CM

## 2013-05-08 DIAGNOSIS — F332 Major depressive disorder, recurrent severe without psychotic features: Secondary | ICD-10-CM | POA: Diagnosis not present

## 2013-06-17 DIAGNOSIS — E782 Mixed hyperlipidemia: Secondary | ICD-10-CM | POA: Diagnosis not present

## 2013-06-17 DIAGNOSIS — I10 Essential (primary) hypertension: Secondary | ICD-10-CM | POA: Diagnosis not present

## 2013-07-04 DIAGNOSIS — E1142 Type 2 diabetes mellitus with diabetic polyneuropathy: Secondary | ICD-10-CM | POA: Diagnosis not present

## 2013-07-04 DIAGNOSIS — Z1331 Encounter for screening for depression: Secondary | ICD-10-CM | POA: Diagnosis not present

## 2013-07-04 DIAGNOSIS — Z139 Encounter for screening, unspecified: Secondary | ICD-10-CM | POA: Diagnosis not present

## 2013-07-04 DIAGNOSIS — E1149 Type 2 diabetes mellitus with other diabetic neurological complication: Secondary | ICD-10-CM | POA: Diagnosis not present

## 2013-07-04 DIAGNOSIS — E782 Mixed hyperlipidemia: Secondary | ICD-10-CM | POA: Diagnosis not present

## 2013-07-04 DIAGNOSIS — I10 Essential (primary) hypertension: Secondary | ICD-10-CM | POA: Diagnosis not present

## 2013-09-13 ENCOUNTER — Other Ambulatory Visit: Payer: Self-pay | Admitting: Vascular Surgery

## 2013-09-13 DIAGNOSIS — I6529 Occlusion and stenosis of unspecified carotid artery: Secondary | ICD-10-CM | POA: Diagnosis not present

## 2013-09-13 DIAGNOSIS — I701 Atherosclerosis of renal artery: Secondary | ICD-10-CM | POA: Diagnosis not present

## 2013-09-16 ENCOUNTER — Encounter: Payer: Self-pay | Admitting: Vascular Surgery

## 2013-09-17 ENCOUNTER — Ambulatory Visit: Payer: Medicare Other | Admitting: Vascular Surgery

## 2013-09-17 ENCOUNTER — Ambulatory Visit
Admission: RE | Admit: 2013-09-17 | Discharge: 2013-09-17 | Disposition: A | Discharge status: Home / Self Care | Payer: Medicare Other | Source: Ambulatory Visit | Attending: Vascular Surgery | Admitting: Vascular Surgery

## 2013-09-17 ENCOUNTER — Encounter: Payer: Self-pay | Admitting: Vascular Surgery

## 2013-09-17 ENCOUNTER — Ambulatory Visit (INDEPENDENT_AMBULATORY_CARE_PROVIDER_SITE_OTHER): Payer: Medicare Other | Admitting: Vascular Surgery

## 2013-09-17 ENCOUNTER — Other Ambulatory Visit: Payer: Medicare Other

## 2013-09-17 VITALS — BP 187/71 | HR 71 | Resp 18 | Ht 62.0 in | Wt 143.0 lb

## 2013-09-17 DIAGNOSIS — I701 Atherosclerosis of renal artery: Secondary | ICD-10-CM | POA: Diagnosis not present

## 2013-09-17 DIAGNOSIS — I6529 Occlusion and stenosis of unspecified carotid artery: Secondary | ICD-10-CM | POA: Diagnosis not present

## 2013-09-17 DIAGNOSIS — I739 Peripheral vascular disease, unspecified: Secondary | ICD-10-CM

## 2013-09-17 MED ORDER — IOHEXOL 350 MG/ML SOLN
80.0000 mL | Freq: Once | INTRAVENOUS | Status: AC | PRN
  Administered 2013-09-17: 80 mL via INTRAVENOUS

## 2013-09-17 NOTE — Progress Notes (Signed)
Subjective:     Patient ID: Lindsey [name], female   DOB: April 26, 1946, 67 y.o.   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 Glade Nurse. Patient had aortobiiliac graft done at that time. She has known abnormal renal duplex scans of both renal arteries. She also has carotid occlusive disease which has been stable with moderate stenosis bilaterally and is asymptomatic. She has a heavy tobacco abuse history currently smoking a pack and half per day and has smoked for 50+ years.  Past Medical History  Diagnosis Date  . Prediabetes   . Hypertension   . Hyperlipidemia   . COPD (chronic obstructive pulmonary disease)   . Fibromyalgia   . Asthma   . Arthritis   . Anxiety     History  Substance Use Topics  . Smoking status: Current Every Day Smoker -- 1.00 packs/day    Types: Cigarettes  . Smokeless tobacco: Never Used  . Alcohol Use: No    Family History  Problem Relation Age of Onset  . Diabetes Other   . Stroke Other     Allergies  Allergen Reactions  . Advair Diskus [Fluticasone-Salmeterol]   . Ambien [Zolpidem]   . Amoxicillin   . Augmentin [Amoxicillin-Pot Clavulanate]   . Biaxin [Clarithromycin]   . Codeine   . Combivent [Ipratropium-Albuterol]   . Decongestant-Antihistamine [Triprolidine-Pse]   . Ephedrine   . Flagyl [Metronidazole]   . Flexeril [Cyclobenzaprine]   . Flonase [Fluticasone Propionate]   . Hctz [Hydrochlorothiazide]     Fainting  . Lotrel [Amlodipine Besy-Benazepril Hcl]   . Macrobid [Nitrofurantoin]   . Meclizine   . Nasonex [Mometasone Furoate]   . Niaspan [Niacin]     Flushing, sweating  . Norvasc [Amlodipine Besylate]   . Paxil [Paroxetine Hcl]   . Phenergan [Promethazine Hcl]   . Prednisone   . Premarin [Conjugated Estrogens]   . Red Dye     Headache, irritable, weakness  . Relafen [Nabumetone]    . Spiriva [Tiotropium Bromide Monohydrate]   . Sulfa Antibiotics     Edema, N&V  . Tequin [Gatifloxacin]   . Tramadol   . Transderm-Scop [Scopolamine]     Current outpatient prescriptions:ALPRAZolam (XANAX) 0.5 MG tablet, Take 0.5 mg by mouth 3 (three) times daily as needed., Disp: , Rfl: ;  aspirin 325 MG tablet, Take 325 mg by mouth daily., Disp: , Rfl: ;  Multiple Vitamin (MULTIVITAMIN) tablet, Take 1 tablet by mouth daily., Disp: , Rfl: ;  omeprazole (PRILOSEC) 20 MG capsule, Take 20 mg by mouth daily., Disp: , Rfl: ;  rosuvastatin (CRESTOR) 10 MG tablet, Take 20 mg by mouth daily. , Disp: , Rfl:  valsartan (DIOVAN) 80 MG tablet, Take 160 mg by mouth daily. , Disp: , Rfl: ;  zolpidem (AMBIEN) 5 MG tablet, Take 5 mg by mouth at bedtime as needed for sleep., Disp: , Rfl: ;  ezetimibe (ZETIA) 10 MG tablet, Take 10 mg by mouth daily., Disp: , Rfl:   BP 187/71  Pulse 71  Resp 18  Ht 5\' 2"  (1.575 m)  Wt 143 lb (64.864 kg)  BMI 26.15 kg/m2  Body mass index is 26.15 kg/(m^2).          Review of Systems complains of bilateral hip and buttock claudication at half block, dyspnea on exertion, productive cough, denies hemoptysis, chest pain, PND, orthopnea. Other systems negative and a  complete review of systems    Objective:   Physical Exam BP 187/71  Pulse 71  Resp 18  Ht 5\' 2"  (1.575 m)  Wt 143 lb (64.864 kg)  BMI 26.15 kg/m2  Gen.-alert and oriented x3 in no apparent distress HEENT normal for age Lungs no rhonchi or wheezing Cardiovascular regular rhythm no murmurs carotid pulses 3+ palpable --bilateral harsh bruits over carotid arteries  Abdomen soft nontender no palpable masses Musculoskeletal free of  major deformities Skin clear -no rashes Neurologic normal Lower extremities-2+ femoral pulses palpable bilaterally. No distal pulses palpable. Both the well-perfused.  Today I ordered a CT angiogram of the abdomen and pelvis which are reviewed by computer. There is  significant atherosclerotic disease in the aorta at the level of the renal arteries between the renal arteries and the graft anastomosis which was done in 1992. This is causing an approximate 50% stenosis or greater of the aorta. It appears that the flow to both renal arteries is not impeded however.       Assessment:     Severe diffuse vascular occlusive disease with ongoing tobacco abuse-status post aortobiiliac graft in 1992 Evidence of some mild to moderate flow reduction of bilateral renal arteries-no uncontrolled hypertension or renal insufficiency at present time Stable claudication in both lower extremities do 2 stenosis of native aorta at level of proximal anastomosis aortobiiliac graft    Plan:     Return in one year with duplex scan carotid arteries and ABIs and renal duplex scan unless patient develops worsening symptoms in the interim

## 2013-09-18 NOTE — Addendum Note (Signed)
Addended by: Mena Goes on: 09/18/2013 10:18 AM   Modules accepted: Orders

## 2013-10-09 DIAGNOSIS — I10 Essential (primary) hypertension: Secondary | ICD-10-CM | POA: Diagnosis not present

## 2013-10-09 DIAGNOSIS — E782 Mixed hyperlipidemia: Secondary | ICD-10-CM | POA: Diagnosis not present

## 2013-10-09 DIAGNOSIS — E1149 Type 2 diabetes mellitus with other diabetic neurological complication: Secondary | ICD-10-CM | POA: Diagnosis not present

## 2013-10-16 DIAGNOSIS — E782 Mixed hyperlipidemia: Secondary | ICD-10-CM | POA: Diagnosis not present

## 2013-10-16 DIAGNOSIS — I1310 Hypertensive heart and chronic kidney disease without heart failure, with stage 1 through stage 4 chronic kidney disease, or unspecified chronic kidney disease: Secondary | ICD-10-CM | POA: Diagnosis not present

## 2013-10-16 DIAGNOSIS — N189 Chronic kidney disease, unspecified: Secondary | ICD-10-CM | POA: Diagnosis not present

## 2013-10-16 DIAGNOSIS — Z1389 Encounter for screening for other disorder: Secondary | ICD-10-CM | POA: Diagnosis not present

## 2013-11-07 DIAGNOSIS — F332 Major depressive disorder, recurrent severe without psychotic features: Secondary | ICD-10-CM | POA: Diagnosis not present

## 2014-02-13 DIAGNOSIS — E1149 Type 2 diabetes mellitus with other diabetic neurological complication: Secondary | ICD-10-CM | POA: Diagnosis not present

## 2014-02-13 DIAGNOSIS — E782 Mixed hyperlipidemia: Secondary | ICD-10-CM | POA: Diagnosis not present

## 2014-02-13 DIAGNOSIS — R220 Localized swelling, mass and lump, head: Secondary | ICD-10-CM | POA: Diagnosis not present

## 2014-02-13 DIAGNOSIS — I10 Essential (primary) hypertension: Secondary | ICD-10-CM | POA: Diagnosis not present

## 2014-02-13 DIAGNOSIS — J441 Chronic obstructive pulmonary disease with (acute) exacerbation: Secondary | ICD-10-CM | POA: Diagnosis not present

## 2014-02-20 DIAGNOSIS — E1149 Type 2 diabetes mellitus with other diabetic neurological complication: Secondary | ICD-10-CM | POA: Diagnosis not present

## 2014-02-20 DIAGNOSIS — I10 Essential (primary) hypertension: Secondary | ICD-10-CM | POA: Diagnosis not present

## 2014-02-20 DIAGNOSIS — E782 Mixed hyperlipidemia: Secondary | ICD-10-CM | POA: Diagnosis not present

## 2014-02-20 DIAGNOSIS — Z6826 Body mass index (BMI) 26.0-26.9, adult: Secondary | ICD-10-CM | POA: Diagnosis not present

## 2014-03-11 ENCOUNTER — Other Ambulatory Visit: Payer: Medicare Other

## 2014-03-11 ENCOUNTER — Ambulatory Visit: Payer: Medicare Other | Admitting: Vascular Surgery

## 2014-03-11 DIAGNOSIS — E119 Type 2 diabetes mellitus without complications: Secondary | ICD-10-CM | POA: Diagnosis not present

## 2014-03-18 ENCOUNTER — Ambulatory Visit: Payer: Medicare Other | Admitting: Vascular Surgery

## 2014-03-18 ENCOUNTER — Other Ambulatory Visit (HOSPITAL_COMMUNITY): Payer: Medicare Other

## 2014-06-19 DIAGNOSIS — F4310 Post-traumatic stress disorder, unspecified: Secondary | ICD-10-CM | POA: Diagnosis not present

## 2014-06-26 DIAGNOSIS — Z1331 Encounter for screening for depression: Secondary | ICD-10-CM | POA: Diagnosis not present

## 2014-06-26 DIAGNOSIS — I10 Essential (primary) hypertension: Secondary | ICD-10-CM | POA: Diagnosis not present

## 2014-06-26 DIAGNOSIS — Z139 Encounter for screening, unspecified: Secondary | ICD-10-CM | POA: Diagnosis not present

## 2014-06-26 DIAGNOSIS — Z0000 Encounter for general adult medical examination without abnormal findings: Secondary | ICD-10-CM | POA: Diagnosis not present

## 2014-06-26 DIAGNOSIS — R220 Localized swelling, mass and lump, head: Secondary | ICD-10-CM | POA: Diagnosis not present

## 2014-06-26 DIAGNOSIS — E1149 Type 2 diabetes mellitus with other diabetic neurological complication: Secondary | ICD-10-CM | POA: Diagnosis not present

## 2014-06-26 DIAGNOSIS — E782 Mixed hyperlipidemia: Secondary | ICD-10-CM | POA: Diagnosis not present

## 2014-08-11 DIAGNOSIS — R042 Hemoptysis: Secondary | ICD-10-CM | POA: Diagnosis not present

## 2014-08-11 DIAGNOSIS — Z6825 Body mass index (BMI) 25.0-25.9, adult: Secondary | ICD-10-CM | POA: Diagnosis not present

## 2014-08-11 DIAGNOSIS — Z720 Tobacco use: Secondary | ICD-10-CM | POA: Diagnosis not present

## 2014-08-11 DIAGNOSIS — J0110 Acute frontal sinusitis, unspecified: Secondary | ICD-10-CM | POA: Diagnosis not present

## 2014-08-12 DIAGNOSIS — R042 Hemoptysis: Secondary | ICD-10-CM | POA: Diagnosis not present

## 2014-09-22 ENCOUNTER — Encounter: Payer: Self-pay | Admitting: Vascular Surgery

## 2014-09-23 ENCOUNTER — Ambulatory Visit (INDEPENDENT_AMBULATORY_CARE_PROVIDER_SITE_OTHER)
Admission: RE | Admit: 2014-09-23 | Discharge: 2014-09-23 | Disposition: A | Discharge status: Home / Self Care | Payer: Medicare Other | Source: Ambulatory Visit | Attending: Family | Admitting: Family

## 2014-09-23 ENCOUNTER — Ambulatory Visit (HOSPITAL_COMMUNITY)
Admission: RE | Admit: 2014-09-23 | Discharge: 2014-09-23 | Disposition: A | Discharge status: Home / Self Care | Payer: Medicare Other | Source: Ambulatory Visit | Attending: Vascular Surgery | Admitting: Vascular Surgery

## 2014-09-23 ENCOUNTER — Encounter: Payer: Medicare Other | Admitting: Family

## 2014-09-23 DIAGNOSIS — I739 Peripheral vascular disease, unspecified: Secondary | ICD-10-CM | POA: Insufficient documentation

## 2014-09-23 DIAGNOSIS — I701 Atherosclerosis of renal artery: Secondary | ICD-10-CM

## 2014-09-23 DIAGNOSIS — I6529 Occlusion and stenosis of unspecified carotid artery: Secondary | ICD-10-CM

## 2014-09-24 ENCOUNTER — Encounter: Payer: Self-pay | Admitting: Vascular Surgery

## 2014-09-24 NOTE — Patient Instructions (Signed)
Smoking Cessation, Tips for Success If you are ready to quit smoking, congratulations! You have chosen to help yourself be healthier. Cigarettes bring nicotine, tar, carbon monoxide, and other irritants into your body. Your lungs, heart, and blood vessels will be able to work better without these poisons. There are many different ways to quit smoking. Nicotine gum, nicotine patches, a nicotine inhaler, or nicotine nasal spray can help with physical craving. Hypnosis, support groups, and medicines help break the habit of smoking. WHAT THINGS CAN I DO TO MAKE QUITTING EASIER?  Here are some tips to help you quit for good:  Pick a date when you will quit smoking completely. Tell all of your friends and family about your plan to quit on that date.  Do not try to slowly cut down on the number of cigarettes you are smoking. Pick a quit date and quit smoking completely starting on that day.  Throw away all cigarettes.   Clean and remove all ashtrays from your home, work, and car.  On a card, write down your reasons for quitting. Carry the card with you and read it when you get the urge to smoke.  Cleanse your body of nicotine. Drink enough water and fluids to keep your urine clear or pale yellow. Do this after quitting to flush the nicotine from your body.  Learn to predict your moods. Do not let a bad situation be your excuse to have a cigarette. Some situations in your life might tempt you into wanting a cigarette.  Never have "just one" cigarette. It leads to wanting another and another. Remind yourself of your decision to quit.  Change habits associated with smoking. If you smoked while driving or when feeling stressed, try other activities to replace smoking. Stand up when drinking your coffee. Brush your teeth after eating. Sit in a different chair when you read the paper. Avoid alcohol while trying to quit, and try to drink fewer caffeinated beverages. Alcohol and  caffeine may urge you to smoke.  Avoid foods and drinks that can trigger a desire to smoke, such as sugary or spicy foods and alcohol.  Ask people who smoke not to smoke around you.  Have something planned to do right after eating or having a cup of coffee. For example, plan to take a walk or exercise.  Try a relaxation exercise to calm you down and decrease your stress. Remember, you may be tense and nervous for the first 2 weeks after you quit, but this will pass.  Find new activities to keep your hands busy. Play with a pen, coin, or rubber band. Doodle or draw things on paper.  Brush your teeth right after eating. This will help cut down on the craving for the taste of tobacco after meals. You can also try mouthwash.   Use oral substitutes in place of cigarettes. Try using lemon drops, carrots, cinnamon sticks, or chewing gum. Keep them handy so they are available when you have the urge to smoke.  When you have the urge to smoke, try deep breathing.  Designate your home as a nonsmoking area.  If you are a heavy smoker, ask your health care provider about a prescription for nicotine chewing gum. It can ease your withdrawal from nicotine.  Reward yourself. Set aside the cigarette money you save and buy yourself something nice.  Look for support from others. Join a support group or smoking cessation program.  Ask someone at home or at work to help you with your plan to quit smoking.  Always ask yourself, "Do I need this cigarette or is this just a reflex?" Tell yourself, "Today, I choose not to smoke," or "I do not want to smoke." You are reminding yourself of your decision to quit.  Do not replace cigarette smoking with electronic cigarettes (commonly called e-cigarettes). The safety of e-cigarettes is unknown, and some may contain harmful chemicals.  If you relapse, do not give up! Plan ahead and think about what you will do the next time you get the urge to smoke. HOW WILL I FEEL  WHEN I QUIT SMOKING? You may have symptoms of withdrawal because your body is used to nicotine (the addictive substance in cigarettes). You may crave cigarettes, be irritable, feel very hungry, cough often, get headaches, or have difficulty concentrating. The withdrawal symptoms are only temporary. They are strongest when you first quit but will go away within 10-14 days. When withdrawal symptoms occur, stay in control. Think about your reasons for quitting. Remind yourself that these are signs that your body is healing and getting used to being without cigarettes. Remember that withdrawal symptoms are easier to treat than the major diseases that smoking can cause.  Even after the withdrawal is over, expect periodic urges to smoke. However, these cravings are generally short lived and will go away whether you smoke or not. Do not smoke! WHAT RESOURCES ARE AVAILABLE TO HELP ME QUIT SMOKING? Your health care provider can direct you to community resources or hospitals for support, which may include:  Group support.  Education.  Hypnosis.  Therapy. Document Released: 06/17/2004 Document Revised: 02/03/2014 Document Reviewed: 03/07/2013 [facility] Patient Information 2015 [place], [place]. This information is not intended to replace advice given to you by your health care provider. Make sure you discuss any questions you have with your health care provider. Renal Artery Stenosis Renal artery stenosis (RAS) is the narrowing of the artery that supplies blood to the kidney. If the narrowing is critical and the kidney does not get enough blood, hypertension (high blood pressure) can develop. This is called renal vascular hypertension (RVH). This is a common, uncommon cause of secondary hypertension. It does not usually happen until there is at least a 70% narrowing of the artery. Decreased blood flow through the renal artery causes the kidney to release increased amounts of a hormone. It is called renin. Renin  is a strong blood pressure regulator. When it is high, it causes changes that lead to hypertension. Eventually the kidney not receiving enough blood may shrink in size and become less useful. The high blood pressure that is produced can eventually damage and destroy the remaining kidney. This is called hypertensive nephrosclerosis. If both kidneys fail, it will lead to chronic renal failure.  CAUSES  Most renal artery stenosis is caused by a hardening of the arteries (atherosclerosis). This is called Atherosclerotic Renal Artery Stenosis (AS-RAS). It is caused by a build-up of cholesterol (plaques) on the inner lining of the renal artery. A much less common cause is Fibromuscular Dysplasia (FMD). With it, there is an abnormality in the muscular lining of the renal artery. FMD-RAS occurs almost exclusively in women aged 16 to 59. It rarely affects African Americans or Asians.  SYMPTOMS  Often high blood pressure is discovered on a routine blood pressure check. It may be the only sign that something is wrong. Other problems that may occur are:  You may develop calf pain  when walking. This is called intermittent claudication. It may be a sign of bad circulation in the legs.  Inability to use certain blood pressure pills such as angiotensin-I (ACE-I) inhibitors or angiotensin receptor blockers (ARB's). These could cause sudden drops in blood pressure with worsening of kidney function.  More than three antihypertensive medications may be needed for blood pressure control.  New onset of high blood pressure if you are over 55. DIAGNOSIS  Your caregiver may find suggestions of this on exam if he finds bruits (like murmurs) on listening to your abdomen (belly) or the large arteries in your neck. Your caregiver may also suspect this there is a sudden worsening of your blood pressure when it has been well controlled and you are over age 34. Additional testing that may be done includes:  One diagnostic method  used for renal artery stenosis (RAS) is to measure and compare the level of renin, (blood pressure-regulating hormone released by the kidneys), in the right to the left renal veins. If the amount of renin released by one-side is markedly higher than the other, this identifies a high renin-releasing kidney consistent with RAS.  FMD-RAS is often found on renal scan with ACE-inhibitor challenge, or ultrasound with Doppler.  FMD responds well to angioplasty and stenting. The results of stenting in FMD are usually long lasting. RISK FACTORS  Most renal artery stenosis is caused by a hardening of the arteries. This is called atherosclerosis. Other risk factors associated with the development of atherosclerotic RAS include the following:   Carotid artery disease.  Obesity.  High blood pressure.  Heredity.  Old age.  Fibromuscular dysplasia.  Diabetes mellitus.  Smoking.  Hardening of the arteries. TREATMENT   Renal vascular hypertension can be very severe. It can also be difficult to control.  Medication is used to control high blood pressure (hypertension). Blood pressure medications that directly affect the renin angiotensin pathway can be used toe help control blood pressure. ACE inhibitors and angiotensin receptor blockers (ARB's) are often effective in patients with unilateral RAS. In some cases, patients with RAS are resistant to these medications.  In patients with bilateral RAS, these medications must be used carefully. They may cause acute renal failure (ARF). If acute renal failure develops (if creatinine increases by more than 30%), the medication is discontinued. The patient is evaluated for bilateral RAS.  Angioplasty and stenting may be used to improve blood flow. The goal is to improve the circulation of blood flow to the kidney and prevent the release of excess renin, which can help to decrease blood pressure. This helps to prevent atrophy of the kidney. In general, patients  with AS-RAS should have stenting done. This is because plasty by itself has a high incidence of re-stenosis.  Surgery to bypass the narrowing may be done. If the kidney with RAS has diminished in size or strength (atrophied ), surgical removal of the kidney may be advised. This is called nephrectomy. Document Released: 06/15/2005 Document Revised: 12/12/2011 Document Reviewed: 01/02/2014 [facility] Patient Information 2015 [place], [place]. This information is not intended to replace advice given to you by your health care provider. Make sure you discuss any questions you have with your health care provider. Peripheral Vascular Disease  Peripheral vascular disease (PVD) is caused by cholesterol buildup in the arteries. The arteries become narrow or clogged. This makes it hard for blood to flow. It happens most in the legs, but it can occur in other areas of your body. HOME CARE   Quit  smoking, if you smoke.  Exercise as told by your doctor.  Follow a low-fat, low-cholesterol diet as told by your doctor.  Control your diabetes, if you have diabetes.  Care for your feet to prevent infection.  Only take medicine as told by your doctor. GET HELP RIGHT AWAY IF:   You have pain or lose feeling (numbness) in your arms or legs.  Your arms or legs turn cold or blue.  You have redness, warmth, and puffiness (swelling) in your arms or legs. MAKE SURE YOU:   Understand these instructions.  Will watch your condition.  Will get help right away if you are not doing well or get worse. Document Released: 12/14/2009 Document Revised: 12/12/2011 Document Reviewed: 12/14/2009 [facility] Patient Information 2015 [place], [place]. This information is not intended to replace advice given to you by your health care provider. Make sure you discuss any questions you have with your health care provider. Dear Ms. Sarah, Your recent Vascular Lab study September 23, 2014 indicates: NO significant changes noted  compared to previous exam of March 07, 2013. Please STOP SMOKING And Follow up   With [place] in  One Year !          Stroke Prevention Some medical conditions and behaviors are associated with an increased chance of having a stroke. You may prevent a stroke by making healthy choices and managing medical conditions. HOW CAN I REDUCE MY RISK OF HAVING A STROKE?   Stay physically active. Get at least 30 minutes of activity on most or all days.  Do not smoke. It may also be helpful to avoid exposure to secondhand smoke.  Limit alcohol use. Moderate alcohol use is considered to be:  No more than 2 drinks per day for men.  No more than 1 drink per day for nonpregnant women.  Eat healthy foods. This involves:  Eating 5 or more servings of fruits and vegetables a day.  Making dietary changes that address high blood pressure (hypertension), high cholesterol, diabetes, or obesity.  Manage your cholesterol levels.  Making food choices that are high in fiber and low in saturated fat, trans fat, and cholesterol may control cholesterol levels.  Take any prescribed medicines to control cholesterol as directed by your health care provider.  Manage your diabetes.  Controlling your carbohydrate and sugar intake is recommended to manage diabetes.  Take any prescribed medicines to control diabetes as directed by your health care provider.  Control your hypertension.  Making food choices that are low in salt (sodium), saturated fat, trans fat, and cholesterol is recommended to manage hypertension.  Take any prescribed medicines to control hypertension as directed by your health care provider.  Maintain a healthy weight.  Reducing calorie intake and making food choices that are low in sodium, saturated fat, trans fat, and cholesterol are recommended to manage weight.  Stop drug abuse.  Avoid taking birth control pills.  Talk to your health care provider about the risks of taking birth  control pills if you are over 38 years old, smoke, get migraines, or have ever had a blood clot.  Get evaluated for sleep disorders (sleep apnea).  Talk to your health care provider about getting a sleep evaluation if you snore a lot or have excessive sleepiness.  Take medicines only as directed by your health care provider.  For some people, aspirin or blood thinners (anticoagulants) are helpful in reducing the risk of forming abnormal blood clots that can lead to stroke. If you have the  irregular heart rhythm of atrial fibrillation, you should be on a blood thinner unless there is a good reason you cannot take them.  Understand all your medicine instructions.  Make sure that other conditions (such as anemia or atherosclerosis) are addressed. SEEK IMMEDIATE MEDICAL CARE IF:   You have sudden weakness or numbness of the face, arm, or leg, especially on one side of the body.  Your face or eyelid droops to one side.  You have sudden confusion.  You have trouble speaking (aphasia) or understanding.  You have sudden trouble seeing in one or both eyes.  You have sudden trouble walking.  You have dizziness.  You have a loss of balance or coordination.  You have a sudden, severe headache with no known cause.  You have new chest pain or an irregular heartbeat. Any of these symptoms may represent a serious problem that is an emergency. Do not wait to see if the symptoms will go away. Get medical help at once. Call your local emergency services (911 in [place] Do not drive yourself to the hospital. Document Released: 10/27/2004 Document Revised: 02/03/2014 Document Reviewed: 03/22/2013 [facility] Patient Information 2015 [place], [place]. This information is not intended to replace advice given to you by your health care provider. Make sure you discuss any questions you have with your health care provider.

## 2014-09-25 NOTE — Progress Notes (Signed)
Lab only 

## 2014-11-24 DIAGNOSIS — E1340 Other specified diabetes mellitus with diabetic neuropathy, unspecified: Secondary | ICD-10-CM | POA: Diagnosis not present

## 2014-11-24 DIAGNOSIS — E782 Mixed hyperlipidemia: Secondary | ICD-10-CM | POA: Diagnosis not present

## 2014-11-24 DIAGNOSIS — I10 Essential (primary) hypertension: Secondary | ICD-10-CM | POA: Diagnosis not present

## 2014-12-01 DIAGNOSIS — R011 Cardiac murmur, unspecified: Secondary | ICD-10-CM | POA: Diagnosis not present

## 2014-12-01 DIAGNOSIS — E782 Mixed hyperlipidemia: Secondary | ICD-10-CM | POA: Diagnosis not present

## 2014-12-01 DIAGNOSIS — I1310 Hypertensive heart and chronic kidney disease without heart failure, with stage 1 through stage 4 chronic kidney disease, or unspecified chronic kidney disease: Secondary | ICD-10-CM | POA: Diagnosis not present

## 2014-12-01 DIAGNOSIS — E1340 Other specified diabetes mellitus with diabetic neuropathy, unspecified: Secondary | ICD-10-CM | POA: Diagnosis not present

## 2014-12-04 DIAGNOSIS — F332 Major depressive disorder, recurrent severe without psychotic features: Secondary | ICD-10-CM | POA: Diagnosis not present

## 2014-12-05 DIAGNOSIS — R011 Cardiac murmur, unspecified: Secondary | ICD-10-CM | POA: Diagnosis not present

## 2014-12-05 DIAGNOSIS — I517 Cardiomegaly: Secondary | ICD-10-CM | POA: Diagnosis not present

## 2014-12-30 DIAGNOSIS — I1310 Hypertensive heart and chronic kidney disease without heart failure, with stage 1 through stage 4 chronic kidney disease, or unspecified chronic kidney disease: Secondary | ICD-10-CM | POA: Diagnosis not present

## 2014-12-30 DIAGNOSIS — Z6825 Body mass index (BMI) 25.0-25.9, adult: Secondary | ICD-10-CM | POA: Diagnosis not present

## 2014-12-30 DIAGNOSIS — I517 Cardiomegaly: Secondary | ICD-10-CM | POA: Diagnosis not present

## 2014-12-30 DIAGNOSIS — E782 Mixed hyperlipidemia: Secondary | ICD-10-CM | POA: Diagnosis not present

## 2015-01-08 DIAGNOSIS — I2510 Atherosclerotic heart disease of native coronary artery without angina pectoris: Secondary | ICD-10-CM | POA: Diagnosis not present

## 2015-01-08 DIAGNOSIS — E785 Hyperlipidemia, unspecified: Secondary | ICD-10-CM | POA: Diagnosis not present

## 2015-01-08 DIAGNOSIS — I10 Essential (primary) hypertension: Secondary | ICD-10-CM | POA: Diagnosis not present

## 2015-01-13 DIAGNOSIS — R079 Chest pain, unspecified: Secondary | ICD-10-CM | POA: Diagnosis not present

## 2015-03-26 DIAGNOSIS — E1340 Other specified diabetes mellitus with diabetic neuropathy, unspecified: Secondary | ICD-10-CM | POA: Diagnosis not present

## 2015-03-26 DIAGNOSIS — E782 Mixed hyperlipidemia: Secondary | ICD-10-CM | POA: Diagnosis not present

## 2015-03-26 DIAGNOSIS — I10 Essential (primary) hypertension: Secondary | ICD-10-CM | POA: Diagnosis not present

## 2015-04-01 DIAGNOSIS — IMO0002 Reserved for concepts with insufficient information to code with codable children: Secondary | ICD-10-CM | POA: Insufficient documentation

## 2015-04-01 DIAGNOSIS — I10 Essential (primary) hypertension: Secondary | ICD-10-CM | POA: Insufficient documentation

## 2015-04-01 DIAGNOSIS — E785 Hyperlipidemia, unspecified: Secondary | ICD-10-CM | POA: Insufficient documentation

## 2015-04-01 DIAGNOSIS — Z9889 Other specified postprocedural states: Secondary | ICD-10-CM | POA: Diagnosis not present

## 2015-04-01 DIAGNOSIS — F17210 Nicotine dependence, cigarettes, uncomplicated: Secondary | ICD-10-CM | POA: Diagnosis not present

## 2015-04-02 DIAGNOSIS — E782 Mixed hyperlipidemia: Secondary | ICD-10-CM | POA: Diagnosis not present

## 2015-04-02 DIAGNOSIS — I1310 Hypertensive heart and chronic kidney disease without heart failure, with stage 1 through stage 4 chronic kidney disease, or unspecified chronic kidney disease: Secondary | ICD-10-CM | POA: Diagnosis not present

## 2015-04-02 DIAGNOSIS — Z6826 Body mass index (BMI) 26.0-26.9, adult: Secondary | ICD-10-CM | POA: Diagnosis not present

## 2015-04-02 DIAGNOSIS — E1349 Other specified diabetes mellitus with other diabetic neurological complication: Secondary | ICD-10-CM | POA: Diagnosis not present

## 2015-04-21 DIAGNOSIS — I10 Essential (primary) hypertension: Secondary | ICD-10-CM | POA: Diagnosis not present

## 2015-05-07 DIAGNOSIS — E871 Hypo-osmolality and hyponatremia: Secondary | ICD-10-CM | POA: Diagnosis not present

## 2015-05-07 DIAGNOSIS — Z1321 Encounter for screening for nutritional disorder: Secondary | ICD-10-CM | POA: Diagnosis not present

## 2015-05-20 DIAGNOSIS — F332 Major depressive disorder, recurrent severe without psychotic features: Secondary | ICD-10-CM | POA: Diagnosis not present

## 2015-05-21 DIAGNOSIS — E119 Type 2 diabetes mellitus without complications: Secondary | ICD-10-CM | POA: Diagnosis not present

## 2015-07-08 IMAGING — CT CT ANGIO ABDOMEN
4 of 9 series · 13 of 36 positions shown, 16 images · IV contrast (80CC OMNI 350)
Comparison: CT of the abdomen and pelvis 09/05/2008.

CLINICAL DATA: Nausea.  Renal artery stenosis.

EXAM:
CT ANGIOGRAPHY ABDOMEN
TECHNIQUE: Multidetector CT imaging of the abdomen was performed using the
standard protocol during bolus administration of intravenous
contrast. Multiplanar reconstructed images including MIPs were
obtained and reviewed to evaluate the vascular anatomy.
CONTRAST:  80mL OMNIPAQUE IOHEXOL 350 MG/ML SOLN

[Series 5: arterial (id) · axial · arterial · 0.78mm/px · z∈[-196,-71]mm · 3 of 100 slices shown]
[im 25/100  soft-tissue]
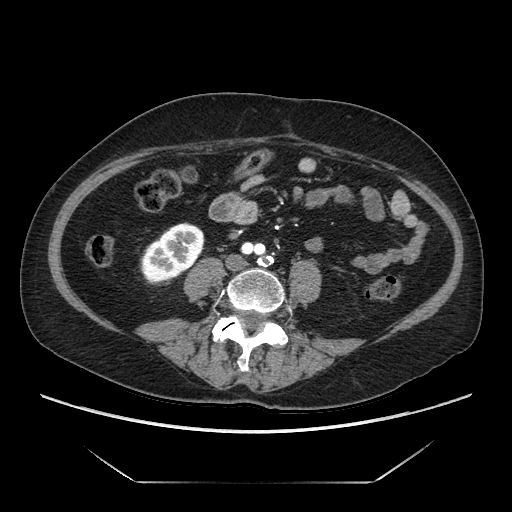
[im 50/100  soft-tissue]
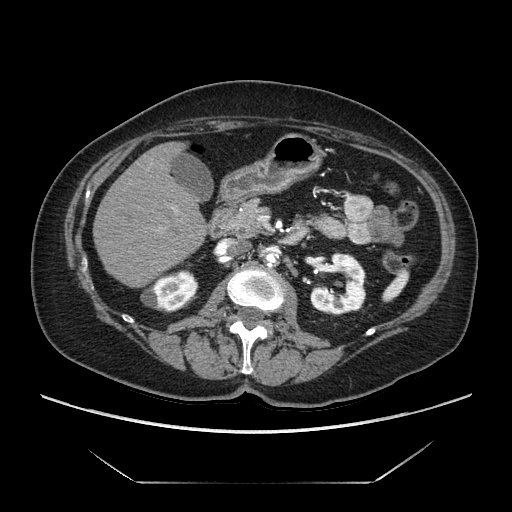
[im 75/100  soft-tissue]
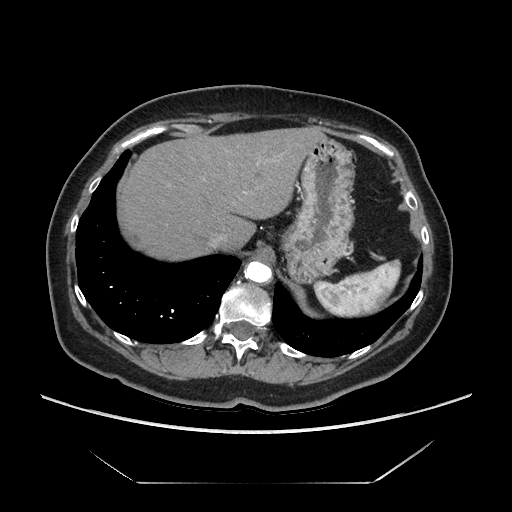

[Series 8: portal venous 5mm · axial · portal-venous · 0.78mm/px · z∈[-254,-8]mm · 2 of 50 slices shown, 5 images]
[im 1/50  soft-tissue]
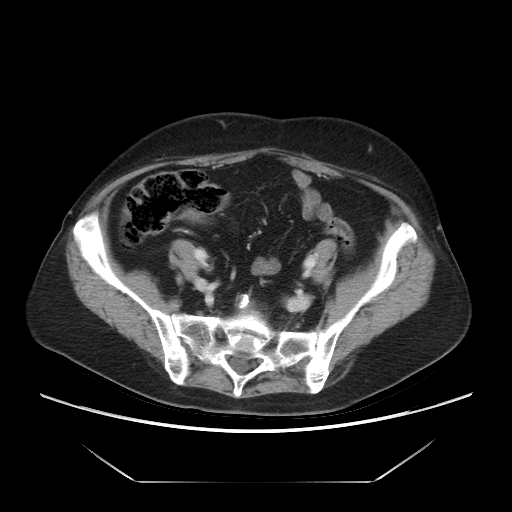
[im 1/50  lung]
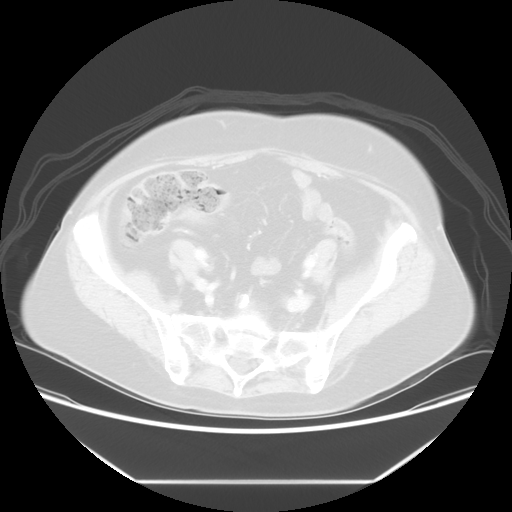
[im 1/50  bone]
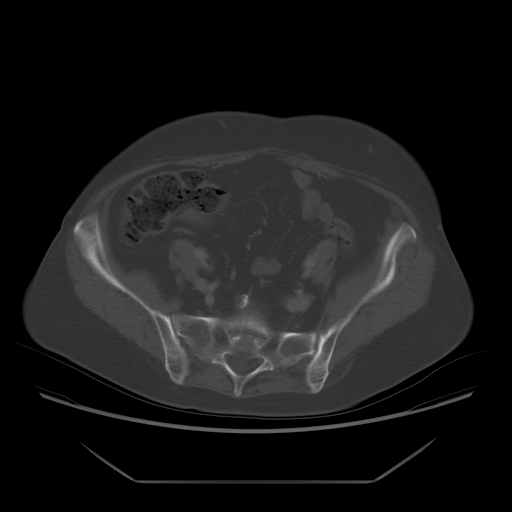
[im 50/50  soft-tissue]
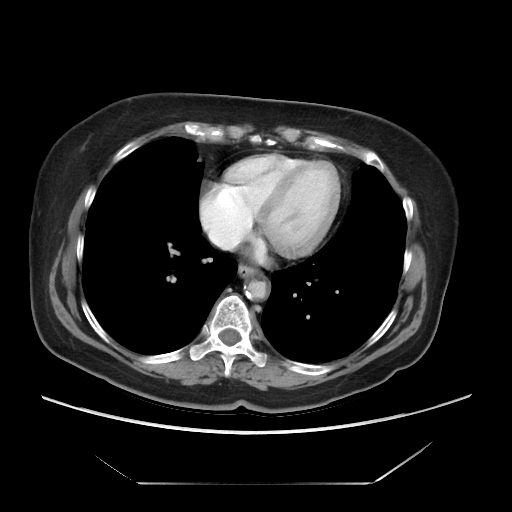
[im 50/50  lung]
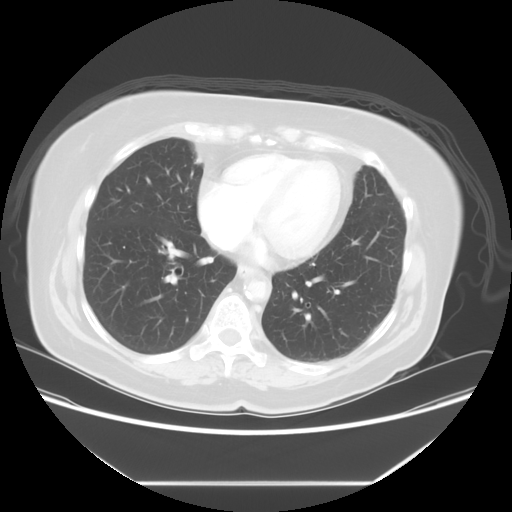

[Series 602: sagittal body · sagittal · 0.78mm/px · 4 of 147 slices shown (1 of 2)]
[im 30/147  soft-tissue]
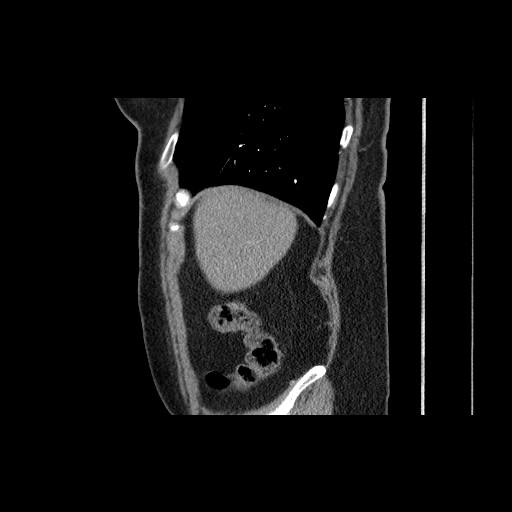
[im 59/147  soft-tissue]
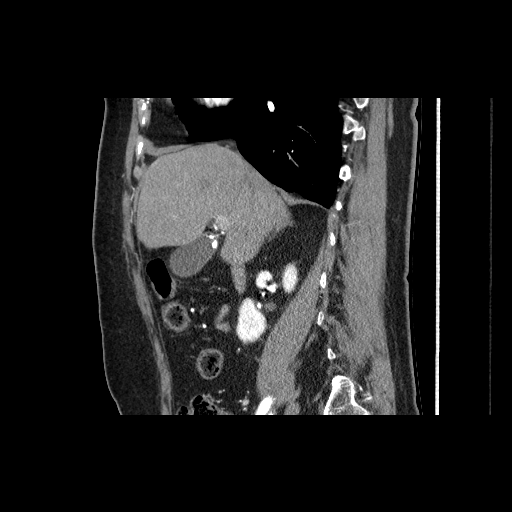
[im 88/147  soft-tissue]
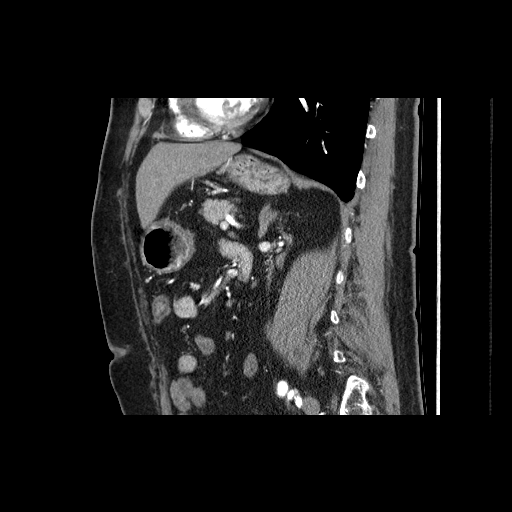
[im 117/147  soft-tissue]
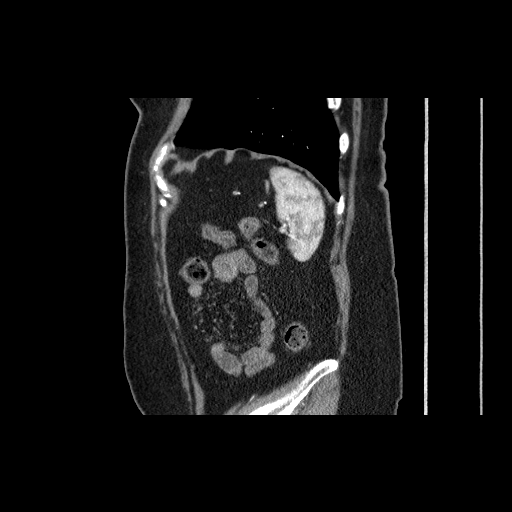

[Series 607: sagittal body · sagittal · 0.78mm/px · 4 of 147 slices shown (2 of 2)]
[im 30/147  soft-tissue]
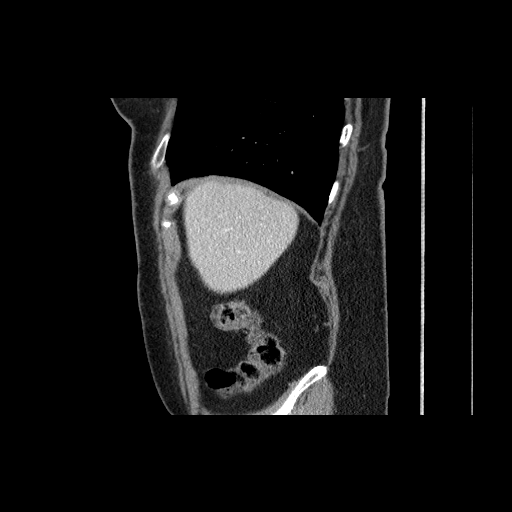
[im 59/147  soft-tissue]
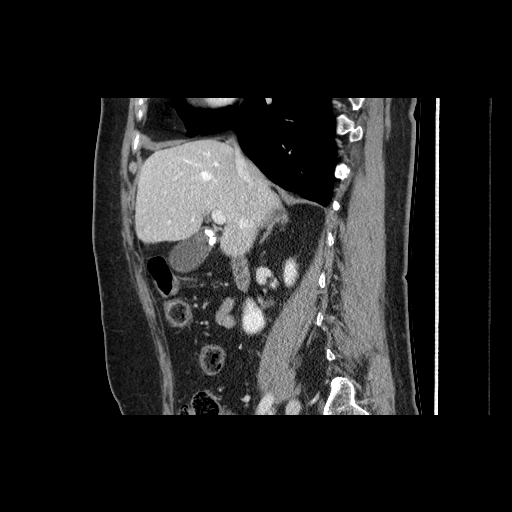
[im 88/147  soft-tissue]
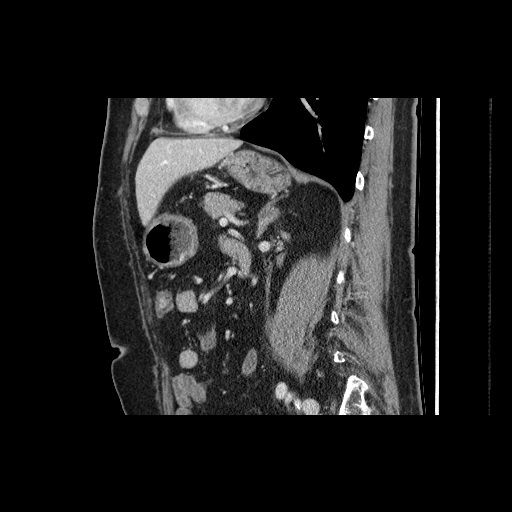
[im 117/147  soft-tissue]
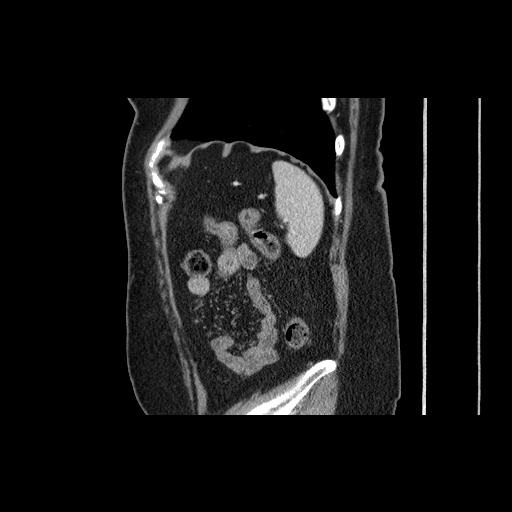

[13 of 36 positions shown; findings below may reference images not displayed]

FINDINGS: Lung Bases: Sessile 6 x 3 mm pleural based nodule in the periphery
of the right lower lobe (image 10 of series 10), unchanged compared
to remote prior study from 06/05/2008; this can be considered a
benign finding, presumably a subpleural lymph node.

Abdomen: Severe atherosclerosis of the native abdominal aorta is
noted, most pronounced in the renal and immediately infrarenal
portions, where the lumen becomes markedly narrowed (minimal luminal
diameter of 6 x 5 mm on image 49 of series 5). Below this, there is
a surgically placed aorto bi-iliac bypass graft. The graft is widely
patent. The distal anastomosis of the graft limbs is not visualized.
Left renal artery stent noted. Renal arteries are grossly patent
bilaterally. Due to the high density of the stent material, accurate
assessment for in-stent restenosis within the left renal artery is
not possible on today's examination. The celiac axis, superior
mesenteric artery and their large branches are all grossly patent on
today's examination. Inferior mesenteric artery origin is not
visualized, but there is flow within the distal inferior mesenteric
artery branches, presumably from collateral vessels.

Calcified gallstones lying in the neck of the gallbladder. No
current findings to suggest acute cholecystitis at this time. The
appearance of the liver, pancreas, spleen and bilateral adrenal
glands is unremarkable. Within the visualized peritoneal cavity
there is no significant volume of ascites, no pneumoperitoneum and
no pathologic distention of small bowel. Several colonic
diverticulae are noted, particularly in the region of this
descending colon, without surrounding inflammatory changes to
suggest an acute diverticulitis at this time.

Musculoskeletal: There are no aggressive appearing lytic or blastic
lesions noted in the visualized portions of the skeleton.

Review of the MIP images confirms the above findings.
IMPRESSION: 1. Status post surgical aorto bi-iliac graft repair with a widely
patent graft. There is severe atherosclerosis of the native
abdominal aorta, with significant stenosis in the aorta at and
immediately below the level of the renal artery origins, as
discussed above. This is similar to the prior examination from
09/05/2008.
2. Left renal artery stent is grossly patent. Secondary to the small
caliber of the stent and high density of the stent material,
accurate assessment for in-stent restenosis is not possible on this
examination.
3. Cholelithiasis without evidence to suggest acute cholecystitis at
this time.
4. Colonic diverticulosis without evidence to suggest acute
diverticulitis at this time.
5. Additional incidental findings, as above.

## 2015-07-23 DIAGNOSIS — I1310 Hypertensive heart and chronic kidney disease without heart failure, with stage 1 through stage 4 chronic kidney disease, or unspecified chronic kidney disease: Secondary | ICD-10-CM | POA: Diagnosis not present

## 2015-07-23 DIAGNOSIS — E1349 Other specified diabetes mellitus with other diabetic neurological complication: Secondary | ICD-10-CM | POA: Diagnosis not present

## 2015-07-23 DIAGNOSIS — E782 Mixed hyperlipidemia: Secondary | ICD-10-CM | POA: Diagnosis not present

## 2015-07-28 DIAGNOSIS — Z0000 Encounter for general adult medical examination without abnormal findings: Secondary | ICD-10-CM | POA: Diagnosis not present

## 2015-07-28 DIAGNOSIS — Z139 Encounter for screening, unspecified: Secondary | ICD-10-CM | POA: Diagnosis not present

## 2015-07-28 DIAGNOSIS — Z1389 Encounter for screening for other disorder: Secondary | ICD-10-CM | POA: Diagnosis not present

## 2015-07-29 DIAGNOSIS — R220 Localized swelling, mass and lump, head: Secondary | ICD-10-CM | POA: Diagnosis not present

## 2015-07-29 DIAGNOSIS — I1310 Hypertensive heart and chronic kidney disease without heart failure, with stage 1 through stage 4 chronic kidney disease, or unspecified chronic kidney disease: Secondary | ICD-10-CM | POA: Diagnosis not present

## 2015-07-29 DIAGNOSIS — E782 Mixed hyperlipidemia: Secondary | ICD-10-CM | POA: Diagnosis not present

## 2015-07-29 DIAGNOSIS — E1349 Other specified diabetes mellitus with other diabetic neurological complication: Secondary | ICD-10-CM | POA: Diagnosis not present

## 2015-08-03 DIAGNOSIS — E1349 Other specified diabetes mellitus with other diabetic neurological complication: Secondary | ICD-10-CM | POA: Diagnosis not present

## 2015-08-04 DIAGNOSIS — R220 Localized swelling, mass and lump, head: Secondary | ICD-10-CM | POA: Diagnosis not present

## 2015-08-06 DIAGNOSIS — R220 Localized swelling, mass and lump, head: Secondary | ICD-10-CM | POA: Diagnosis not present

## 2015-08-06 DIAGNOSIS — L728 Other follicular cysts of the skin and subcutaneous tissue: Secondary | ICD-10-CM | POA: Diagnosis not present

## 2015-09-01 DIAGNOSIS — R05 Cough: Secondary | ICD-10-CM | POA: Diagnosis not present

## 2015-09-01 DIAGNOSIS — M545 Low back pain: Secondary | ICD-10-CM | POA: Diagnosis not present

## 2015-09-01 DIAGNOSIS — G8929 Other chronic pain: Secondary | ICD-10-CM | POA: Diagnosis not present

## 2015-09-01 DIAGNOSIS — Z6827 Body mass index (BMI) 27.0-27.9, adult: Secondary | ICD-10-CM | POA: Diagnosis not present

## 2015-09-02 DIAGNOSIS — M47816 Spondylosis without myelopathy or radiculopathy, lumbar region: Secondary | ICD-10-CM | POA: Diagnosis not present

## 2015-09-02 DIAGNOSIS — R05 Cough: Secondary | ICD-10-CM | POA: Diagnosis not present

## 2015-09-02 DIAGNOSIS — M545 Low back pain: Secondary | ICD-10-CM | POA: Diagnosis not present

## 2015-09-21 DIAGNOSIS — Z9889 Other specified postprocedural states: Secondary | ICD-10-CM | POA: Diagnosis not present

## 2015-09-21 DIAGNOSIS — F17210 Nicotine dependence, cigarettes, uncomplicated: Secondary | ICD-10-CM | POA: Diagnosis not present

## 2015-09-21 DIAGNOSIS — E785 Hyperlipidemia, unspecified: Secondary | ICD-10-CM | POA: Diagnosis not present

## 2015-09-21 DIAGNOSIS — I10 Essential (primary) hypertension: Secondary | ICD-10-CM | POA: Diagnosis not present

## 2015-10-06 ENCOUNTER — Ambulatory Visit: Payer: Medicare Other | Admitting: Family

## 2015-10-06 ENCOUNTER — Encounter (HOSPITAL_COMMUNITY): Payer: Medicare Other

## 2015-10-14 ENCOUNTER — Ambulatory Visit: Payer: Medicare Other | Admitting: Family

## 2015-11-05 DIAGNOSIS — E782 Mixed hyperlipidemia: Secondary | ICD-10-CM | POA: Diagnosis not present

## 2015-11-05 DIAGNOSIS — I1310 Hypertensive heart and chronic kidney disease without heart failure, with stage 1 through stage 4 chronic kidney disease, or unspecified chronic kidney disease: Secondary | ICD-10-CM | POA: Diagnosis not present

## 2015-11-05 DIAGNOSIS — E1349 Other specified diabetes mellitus with other diabetic neurological complication: Secondary | ICD-10-CM | POA: Diagnosis not present

## 2015-11-12 DIAGNOSIS — F17200 Nicotine dependence, unspecified, uncomplicated: Secondary | ICD-10-CM | POA: Diagnosis not present

## 2015-11-12 DIAGNOSIS — E1349 Other specified diabetes mellitus with other diabetic neurological complication: Secondary | ICD-10-CM | POA: Diagnosis not present

## 2015-11-12 DIAGNOSIS — E782 Mixed hyperlipidemia: Secondary | ICD-10-CM | POA: Diagnosis not present

## 2015-11-12 DIAGNOSIS — I1310 Hypertensive heart and chronic kidney disease without heart failure, with stage 1 through stage 4 chronic kidney disease, or unspecified chronic kidney disease: Secondary | ICD-10-CM | POA: Diagnosis not present

## 2015-11-18 DIAGNOSIS — F4310 Post-traumatic stress disorder, unspecified: Secondary | ICD-10-CM | POA: Diagnosis not present

## 2016-01-04 ENCOUNTER — Ambulatory Visit (INDEPENDENT_AMBULATORY_CARE_PROVIDER_SITE_OTHER)
Admission: RE | Admit: 2016-01-04 | Discharge: 2016-01-04 | Disposition: A | Discharge status: Home / Self Care | Payer: Medicare Other | Source: Ambulatory Visit | Attending: Family | Admitting: Family

## 2016-01-04 DIAGNOSIS — I739 Peripheral vascular disease, unspecified: Secondary | ICD-10-CM

## 2016-01-04 DIAGNOSIS — E119 Type 2 diabetes mellitus without complications: Secondary | ICD-10-CM | POA: Diagnosis not present

## 2016-01-04 DIAGNOSIS — I6523 Occlusion and stenosis of bilateral carotid arteries: Secondary | ICD-10-CM

## 2016-01-04 DIAGNOSIS — N281 Cyst of kidney, acquired: Secondary | ICD-10-CM | POA: Insufficient documentation

## 2016-01-04 DIAGNOSIS — I700 Atherosclerosis of aorta: Secondary | ICD-10-CM | POA: Diagnosis not present

## 2016-01-04 DIAGNOSIS — E785 Hyperlipidemia, unspecified: Secondary | ICD-10-CM | POA: Diagnosis not present

## 2016-01-04 DIAGNOSIS — I701 Atherosclerosis of renal artery: Secondary | ICD-10-CM | POA: Diagnosis not present

## 2016-01-04 DIAGNOSIS — I10 Essential (primary) hypertension: Secondary | ICD-10-CM | POA: Diagnosis not present

## 2016-01-04 DIAGNOSIS — Z95828 Presence of other vascular implants and grafts: Secondary | ICD-10-CM | POA: Insufficient documentation

## 2016-01-20 ENCOUNTER — Encounter: Payer: Self-pay | Admitting: Family

## 2016-01-28 ENCOUNTER — Encounter: Payer: Self-pay | Admitting: Family

## 2016-01-28 ENCOUNTER — Ambulatory Visit (HOSPITAL_COMMUNITY)
Admission: RE | Admit: 2016-01-28 | Discharge: 2016-01-28 | Disposition: A | Discharge status: Home / Self Care | Payer: Medicare Other | Source: Ambulatory Visit | Attending: Family | Admitting: Family

## 2016-01-28 ENCOUNTER — Ambulatory Visit (INDEPENDENT_AMBULATORY_CARE_PROVIDER_SITE_OTHER): Payer: Medicare Other | Admitting: Family

## 2016-01-28 ENCOUNTER — Ambulatory Visit (INDEPENDENT_AMBULATORY_CARE_PROVIDER_SITE_OTHER)
Admission: RE | Admit: 2016-01-28 | Discharge: 2016-01-28 | Disposition: A | Discharge status: Home / Self Care | Payer: Medicare Other | Source: Ambulatory Visit | Attending: Family | Admitting: Family

## 2016-01-28 ENCOUNTER — Other Ambulatory Visit: Payer: Self-pay | Admitting: Vascular Surgery

## 2016-01-28 VITALS — BP 180/72 | HR 65 | Temp 97.9°F | Resp 18 | Ht 62.0 in | Wt 152.0 lb

## 2016-01-28 DIAGNOSIS — Z95828 Presence of other vascular implants and grafts: Secondary | ICD-10-CM | POA: Diagnosis not present

## 2016-01-28 DIAGNOSIS — I6523 Occlusion and stenosis of bilateral carotid arteries: Secondary | ICD-10-CM

## 2016-01-28 DIAGNOSIS — I10 Essential (primary) hypertension: Secondary | ICD-10-CM | POA: Diagnosis not present

## 2016-01-28 DIAGNOSIS — I739 Peripheral vascular disease, unspecified: Secondary | ICD-10-CM

## 2016-01-28 DIAGNOSIS — I6529 Occlusion and stenosis of unspecified carotid artery: Secondary | ICD-10-CM | POA: Diagnosis not present

## 2016-01-28 DIAGNOSIS — Z959 Presence of cardiac and vascular implant and graft, unspecified: Secondary | ICD-10-CM

## 2016-01-28 DIAGNOSIS — Z720 Tobacco use: Secondary | ICD-10-CM

## 2016-01-28 DIAGNOSIS — E785 Hyperlipidemia, unspecified: Secondary | ICD-10-CM | POA: Diagnosis not present

## 2016-01-28 DIAGNOSIS — F17200 Nicotine dependence, unspecified, uncomplicated: Secondary | ICD-10-CM

## 2016-01-28 DIAGNOSIS — I701 Atherosclerosis of renal artery: Secondary | ICD-10-CM

## 2016-01-28 DIAGNOSIS — I779 Disorder of arteries and arterioles, unspecified: Secondary | ICD-10-CM

## 2016-01-28 NOTE — Progress Notes (Signed)
[facility] [place] HISTORY AND PHYSICAL   MRN : [id number]  History of Present Illness:   Lindsey [name] is a 70 y.o. female patient of Dr. 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 Glade Nurse. Patient had aortobiiliac graft done at that time. She has known abnormal renal duplex scans of both renal arteries. She also has carotid occlusive disease which has been stable with moderate stenosis bilaterally and is asymptomatic. She has a heavy tobacco abuse history currently smoking a pack and half per day and has smoked for 50+ years.  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 She denies non healing wounds. Pt states pain in her right low back and hip has elevated her blood pressure. She has discussed this with her PCP. Pt states she has had low back and right hip pain for years, was taking long term Percocet or Darvocet for pain.  She denies any hx of stroke or TIA.  Pt Diabetic: Yes, A1C was 6.1 according to lab results she brought with her, [name] Pt smoker: smoker  (1.5 ppd, started at age 25 yrs)  Pt meds include: Statin :No Betablocker: No ASA: Yes Other anticoagulants/antiplatelets: no  Current Outpatient Prescriptions  Medication Sig Dispense Refill  . ALPRAZolam (XANAX) 0.5 MG tablet Take 0.5 mg by mouth 3 (three) times daily as needed.    Marland Kitchen aspirin 325 MG tablet Take 325 mg by mouth daily.    . Cholecalciferol (VITAMIN D3) 2000 units TABS Take by mouth.    . ezetimibe (ZETIA) 10 MG tablet Take 10 mg by mouth daily.    . Multiple Vitamin (MULTIVITAMIN) tablet Take 1 tablet by mouth daily.    Marland Kitchen omeprazole (PRILOSEC) 20 MG capsule Take 20 mg by mouth daily.    . valsartan (DIOVAN) 80 MG tablet Take 160 mg by mouth 2 (two) times daily.     Marland Kitchen zolpidem  (AMBIEN) 5 MG tablet Take 5 mg by mouth at bedtime as needed for sleep.     No current facility-administered medications for this visit.    Past Medical History  Diagnosis Date  . Prediabetes   . Hypertension   . Hyperlipidemia   . COPD (chronic obstructive pulmonary disease) ([place])   . Fibromyalgia   . Asthma   . Arthritis   . Anxiety     Social History Social History  Substance Use Topics  . Smoking status: Current Every Day Smoker -- 1.50 packs/day    Types: Cigarettes  . Smokeless tobacco: Never Used  . Alcohol Use: No    Family History Family History  Problem Relation Age of Onset  . Diabetes Other   . Stroke Other     Surgical History Past Surgical History  Procedure Laterality Date  . Abdominal hysterectomy    . Pr vein bypass graft,aorto-fem-pop  1992    by Dr. Scot Dock  . Incisional hernia repair    . Renal artery stent  2009    Left renal artery   done by Dr. Amedeo Plenty  . Appendectomy  2009    Allergies  Allergen Reactions  . Advair Diskus [Fluticasone-Salmeterol]   . Ambien [Zolpidem]   . Amoxicillin   . Augmentin [Amoxicillin-Pot Clavulanate]   . Biaxin [Clarithromycin]   . Codeine   . Combivent [Ipratropium-Albuterol]   .  Crestor [Rosuvastatin Calcium] Other (See Comments)    Myalgias  . Decongestant-Antihistamine [Triprolidine-Pseudoephedrine]   . Ephedrine   . Flagyl [Metronidazole]   . Flexeril [Cyclobenzaprine]   . Flonase [Fluticasone Propionate]   . Hctz [Hydrochlorothiazide]     Fainting  . Lotrel [Amlodipine Besy-Benazepril Hcl]   . Macrobid [Nitrofurantoin]   . Meclizine   . Nasonex [Mometasone Furoate]   . Niaspan [Niacin]     Flushing, sweating  . Norvasc [Amlodipine Besylate]   . Paxil [Paroxetine Hcl]   . Phenergan [Promethazine Hcl]   . Prednisone   . Premarin [Conjugated Estrogens]   . Red Dye     Headache, irritable, weakness  . Relafen [Nabumetone]   . Spiriva [Tiotropium Bromide Monohydrate]   . Sulfa Antibiotics      Edema, N&V  . Tequin [Gatifloxacin]   . Tramadol   . Transderm-Scop [Scopolamine]     Current Outpatient Prescriptions  Medication Sig Dispense Refill  . ALPRAZolam (XANAX) 0.5 MG tablet Take 0.5 mg by mouth 3 (three) times daily as needed.    Marland Kitchen aspirin 325 MG tablet Take 325 mg by mouth daily.    . Cholecalciferol (VITAMIN D3) 2000 units TABS Take by mouth.    . ezetimibe (ZETIA) 10 MG tablet Take 10 mg by mouth daily.    . Multiple Vitamin (MULTIVITAMIN) tablet Take 1 tablet by mouth daily.    Marland Kitchen omeprazole (PRILOSEC) 20 MG capsule Take 20 mg by mouth daily.    . valsartan (DIOVAN) 80 MG tablet Take 160 mg by mouth 2 (two) times daily.     Marland Kitchen zolpidem (AMBIEN) 5 MG tablet Take 5 mg by mouth at bedtime as needed for sleep.     No current facility-administered medications for this visit.     REVIEW OF SYSTEMS: See HPI for pertinent positives and negatives.  Physical Examination Filed Vitals:   01/28/16 1530 01/28/16 1534  BP: 184/68 180/72  Pulse: 65 65  Temp: 97.9 F (36.6 C)   TempSrc: Oral   Resp: 18   Height: 5\' 2"  (1.575 m)   Weight: 152 lb (68.947 kg)   SpO2: 100%    Body mass index is 27.79 kg/(m^2).  General:  WDWN in NAD Gait: antalgic HENT: WNL Eyes: Pupils equal Pulmonary: normal non-labored breathing, limited air movement in all fields Cardiac: RRR, + murmur   Abdomen: soft, NT, no masses palpated Skin: no rashes, no ulcers, no cellulitis.   VASCULAR EXAM  Carotid Bruits Right Left   Positive Positive        Bilateral radial pulses are 2+ palpable Aorta is not palpable                     VASCULAR EXAM: Extremities without ischemic changes, without Gangrene; without open wounds.                                                                                                          LE Pulses Right Left       FEMORAL  1+ palpable  2+  palpable        POPLITEAL  not palpable   not palpable       POSTERIOR TIBIAL  not palpable   not  palpable        DORSALIS PEDIS      ANTERIOR TIBIAL 2+ palpable  not palpable    Musculoskeletal: no muscle wasting or atrophy; no peripheral edema. Smooth raised mass left side of face, states her PCP is aware of this.  Neurologic: A&O X 3; Appropriate Affect; painful (+) bilateral straight leg raise SENSATION: normal; MOTOR FUNCTION: 5/5 Symmetric, CN 2-12 intact Speech is fluent/normal    Non-Invasive Vascular Imaging:   CEREBROVASCULAR DUPLEX EVALUATION 01/28/16    INDICATION: Carotid artery disease    PREVIOUS INTERVENTION(S):     DUPLEX EXAM: Carotid duplex    RIGHT  LEFT  Peak Systolic Velocities (cm/s) End Diastolic Velocities (cm/s) Plaque LOCATION Peak Systolic Velocities (cm/s) End Diastolic Velocities (cm/s) Plaque  127 15 HT CCA PROXIMAL 142 21 HT  113 14  CCA MID 120 19 HT  121 13 HT CCA DISTAL 113 15 HT  173 12 HT ECA 162 12 HT  240 30 CP ICA PROXIMAL 133 20 HT  151 28  ICA MID 138 16   123 27  ICA DISTAL 134 25     2.1 ICA / CCA Ratio (PSV) 1.1  Antegrade Vertebral Flow Antegrade  - Brachial Systolic Pressure (mmHg) -  Biphasic Brachial Artery Waveforms Biphasic    Plaque Morphology:  HM = Homogeneous, HT = Heterogeneous, CP = Calcific Plaque, SP = Smooth Plaque, IP = Irregular Plaque     ADDITIONAL FINDINGS: Biphasic subclavian arteries    IMPRESSION: 1. 40 - 59% right internal carotid artery stenosis, lower end of range 2. Less than 40% left internal carotid artery stenosis    Compared to the previous exam:  No significant change since exam of 09/23/2014    Renal Artery Duplex: 01/04/16: Difficult exam due to multiple surgical procedures, scar tissue, hernia mesh implant. Known aortic stenosis makes renal aortic ratio invalid. Elevated velocities right renal artery c/w >60% diameter reduction. Elevated velocities in the left renal artery stent (297 cm/s proximal stent). Left kidney is below normal at 8.6 cm. Two cystic appearing structures on  the right kidney measuring 0.98 cm x 0.78 cm and 2.5 cm x 1.6 cm. No significant change compared to previous exam.  ABI (Date: 01/04/2016 )  R: 0.74 (0.77, 09/23/14), DP: triphasic, PT: triphasic, TBI: 0.50  L: 0.75 (0.85), DP: triphasic, PT: biphasic, TBI: 0.51   ASSESSMENT:  Lindsey [name] is a 70 y.o. female who had a left renal artery stent placed in 2009 and has severe aortoiliac occlusive disease, treated in 1992 by Dr. Glade Nurse. Patient had aortobiiliac graft done at that time. She has known abnormal renal duplex scans of both renal arteries. She also has carotid occlusive disease which has been stable with moderate stenosis bilaterally and is asymptomatic. She has a heavy tobacco abuse history currently smoking a pack and half per day and has smoked for 50+ years.  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 She has a positive bilateral straight leg raise test indicative of radiculopathy. She has no signs of ischemia in her feet/legs Pt states pain in her right low back and hip has elevated her blood pressure. She has discussed this with her PCP. Pt states she has had low back and right hip  pain for years, was taking long term Percocet or Darvocet for pain. ABI's indicate moderate bilateral arterial occlusive disease with tri and biphasic waveforms. It is therefore unlikely that the severe pain she has had for years in her low back and right hip would be from lack of arterial perfusion.  She denies any hx of stroke or TIA.  Today's carotid duplex suggests 40 - 59% right internal carotid artery stenosis, lower end of range Less than 40% left internal carotid artery stenosis. No significant change since exam of 09/23/2014.   Renal artery duplex shows the same amount of renal artery stenosis as the previous duplex indicated. She is only on one antihypertensive medication, her blood pressure is uncontrolled at this time.  I advised pt to see  her PCP to address her elevated blood pressure.  Also consider referral to a spine specialist or neurosurgeon for an evaluation of her lumbar spine and radiculopathy symptoms, will defer to pt's PCP.  Face to face time with patient was 25 minutes. Over 50% of this time was spent on counseling and coordination of care.  PLAN:   The patient was counseled re smoking cessation and given several free resources re smoking cessation.  Pt is unable to participate in a graduated walking program to improve her lower extremity arterial perfusion due to severe low back and right hip pain with walking. I advised daily seated leg exercises which I discussed with the pt and her friend and demonstrated.   Based on today's exam and non-invasive vascular lab results, the patient will follow up in 1 year with the following tests: ABI's, bilateral renal artery duplex, and carotid duplex. I discussed in depth with the patient the nature of atherosclerosis, and emphasized the importance of maximal medical management including strict control of blood pressure, blood glucose, and lipid levels, obtaining regular exercise, and cessation of smoking.  The patient is aware that without maximal medical management the underlying atherosclerotic disease process will progress, limiting the benefit of any interventions.  The patient was given information about stroke prevention and what symptoms should prompt the patient to seek immediate medical care. . The patient was given information about PAD including signs, symptoms, treatment, what symptoms should prompt the patient to seek immediate medical care, and risk reduction measures to take. Thank you for allowing [place] to participate in this patient's care.  Clemon Chambers, RN, MSN, FNP-C [facility] Office: [phone]  Clinic MD: [facility]  01/28/2016 3:47 PM

## 2016-01-28 NOTE — Patient Instructions (Signed)
Stroke Prevention Some medical conditions and behaviors are associated with an increased chance of having a stroke. You may prevent a stroke by making healthy choices and managing medical conditions. HOW CAN I REDUCE MY RISK OF HAVING A STROKE?   Stay physically active. Get at least 30 minutes of activity on most or all days.  Do not smoke. It may also be helpful to avoid exposure to secondhand smoke.  Limit alcohol use. Moderate alcohol use is considered to be:  No more than 2 drinks per day for men.  No more than 1 drink per day for nonpregnant women.  Eat healthy foods. This involves:  Eating 5 or more servings of fruits and vegetables a day.  Making dietary changes that address high blood pressure (hypertension), high cholesterol, diabetes, or obesity.  Manage your cholesterol levels.  Making food choices that are high in fiber and low in saturated fat, trans fat, and cholesterol may control cholesterol levels.  Take any prescribed medicines to control cholesterol as directed by your health care provider.  Manage your diabetes.  Controlling your carbohydrate and sugar intake is recommended to manage diabetes.  Take any prescribed medicines to control diabetes as directed by your health care provider.  Control your hypertension.  Making food choices that are low in salt (sodium), saturated fat, trans fat, and cholesterol is recommended to manage hypertension.  Ask your health care provider if you need treatment to lower your blood pressure. Take any prescribed medicines to control hypertension as directed by your health care provider.  If you are 18-39 years of age, have your blood pressure checked every 3-5 years. If you are 40 years of age or older, have your blood pressure checked every year.  Maintain a healthy weight.  Reducing calorie intake and making food choices that are low in sodium, saturated fat, trans fat, and cholesterol are recommended to manage  weight.  Stop drug abuse.  Avoid taking birth control pills.  Talk to your health care provider about the risks of taking birth control pills if you are over 35 years old, smoke, get migraines, or have ever had a blood clot.  Get evaluated for sleep disorders (sleep apnea).  Talk to your health care provider about getting a sleep evaluation if you snore a lot or have excessive sleepiness.  Take medicines only as directed by your health care provider.  For some people, aspirin or blood thinners (anticoagulants) are helpful in reducing the risk of forming abnormal blood clots that can lead to stroke. If you have the irregular heart rhythm of atrial fibrillation, you should be on a blood thinner unless there is a good reason you cannot take them.  Understand all your medicine instructions.  Make sure that other conditions (such as anemia or atherosclerosis) are addressed. SEEK IMMEDIATE MEDICAL CARE IF:   You have sudden weakness or numbness of the face, arm, or leg, especially on one side of the body.  Your face or eyelid droops to one side.  You have sudden confusion.  You have trouble speaking (aphasia) or understanding.  You have sudden trouble seeing in one or both eyes.  You have sudden trouble walking.  You have dizziness.  You have a loss of balance or coordination.  You have a sudden, severe headache with no known cause.  You have new chest pain or an irregular heartbeat. Any of these symptoms may represent a serious problem that is an emergency. Do not wait to see if the symptoms will   go away. Get medical help at once. Call your local emergency services (911 in [place] Do not drive yourself to the hospital.   This information is not intended to replace advice given to you by your health care provider. Make sure you discuss any questions you have with your health care provider.   Document Released: 10/27/2004 Document Revised: 10/10/2014 Document Reviewed:  03/22/2013 Elsevier Interactive Patient Education 2016 Elsevier Inc[employer]    Renal Artery Stenosis Renal artery stenosis (RAS) is narrowing of the artery that carries blood to your kidneys. It can affect one or both kidneys. Your kidneys filter waste and extra fluid from your blood. You get rid of the waste and fluid when you urinate. Your kidneys also make an important chemical messenger (hormone) called renin. Renin helps regulate your blood pressure. The first sign of RAS may be high blood pressure. Over time, other symptoms can develop. CAUSES  Plaque buildup in your arteries (atherosclerosis) is the main cause of RAS. The plaques that cause this are made up of:  Fat.  Cholesterol.  Calcium.  Other substances. As these substances build up in your renal artery, this slows the blood supply to your kidneys. The lack of blood and oxygen causes the signs and symptoms of RAS. A much less common cause of RAS is a disease called fibromuscular dysplasia. This disease causes abnormal cell growth that narrows the renal artery. It is not related to atherosclerosis. It occurs mostly in women who are 60-72 years old. It may be passed down through families. RISK FACTORS  You may be at risk for renal artery stenosis if you:  Are a man who is at least 70 years old.  Are a woman who is at least 70 years old.  Have high blood pressure.  Have high cholesterol.  Are a smoker.  Abuse alcohol.  Have diabetes or prediabetes.  Are overweight.  Have a family history of early heart disease. SIGNS AND SYMPTOMS  RAS usually develops slowly. You may not have any signs or symptoms at first. The earliest signs may be:  Developing high blood pressure.  A sudden increase in existing high blood pressure.  No longer responding to medicine that used to control your blood pressure. Later signs and symptoms are due to kidney damage. They may include:  Fatigue.  Shortness of breath.  Swollen legs and  feet.  Dry skin.  Headaches.  Muscle cramps.  Loss of appetite.  Nausea or vomiting. DIAGNOSIS  Your health care provider may suspect RAS based on changes in your blood pressure and your risk factors. A physical exam will be done. Your health care provider may use a stethoscope to listen for a whooshing sound (bruit) that can occur where the renal artery is blocking blood flow. Several tests may be done to confirm a diagnosis of RAS. These may include:  Blood and urine tests to check your kidney function.  Imaging tests of your kidneys, such as:  A test that involves using sound waves to create an image of your kidneys and the blood flow to your kidneys (ultrasound).  A test in which dye is injected into one of your blood vessels so images can be taken as the dye flows through your renal arteries (angiogram). These tests can be done using X-rays, a CT scan (computed tomography angiogram, CTA), or a type of MRI (magnetic resonance angiogram, MRA). TREATMENT  Making lifestyle changes to reduce your risk factors is the first treatment option for early RAS. If the  blood flow to one of your kidneys is cut by more than half, you may need medicine to:  Lower your blood pressure. This is the main medical treatment for RAS. You may need more than one type of medicine for this. The two types that work best for RAS are:  ACE inhibitors.  Angiotensin receptor blockers.  Reduce fluid in the body (diuretics).  Lower your cholesterol (statins). If medicine is not enough to control RAS, you may need surgery. This may involve:  Threading a tube with an inflatable balloon into the renal artery to force it open (angioplasty).  Removing plaque from inside the artery (endarterectomy). HOME CARE INSTRUCTIONS  Take medicines only as directed by your health care provider.  Make any lifestyle changes recommended by your health care provider. This may include:  Working with a dietitian to maintain  a heart-healthy diet. This type of diet is low in saturated fat, salt, and added sugar.  Starting an exercise program as directed by your health care provider.  Maintaining a healthy weight.  Quitting smoking.  Not abusing alcohol.  Keep all follow-up visits as directed by your health care provider. This is important. SEEK MEDICAL CARE IF:  Your symptoms of RAS are not getting better.  Your symptoms are changing or getting worse. SEEK IMMEDIATE MEDICAL CARE IF:  You have very bad pain in your back or abdomen.  You have blood in your urine.   This information is not intended to replace advice given to you by your health care provider. Make sure you discuss any questions you have with your health care provider.   Document Released: 06/15/2005 Document Revised: 10/10/2014 Document Reviewed: 01/02/2014 Elsevier Interactive Patient Education 2016 Elsevier Inc.    Peripheral Vascular Disease Peripheral vascular disease (PVD) is a disease of the blood vessels that are not part of your heart and brain. A simple term for PVD is poor circulation. In most cases, PVD narrows the blood vessels that carry blood from your heart to the rest of your body. This can result in a decreased supply of blood to your arms, legs, and internal organs, like your stomach or kidneys. However, it most often affects a person's lower legs and feet. There are two types of PVD.  Organic PVD. This is the more common type. It is caused by damage to the structure of blood vessels.  Functional PVD. This is caused by conditions that make blood vessels contract and tighten (spasm). Without treatment, PVD tends to get worse over time. PVD can also lead to acute ischemic limb. This is when an arm or limb suddenly has trouble getting enough blood. This is a medical emergency. CAUSES Each type of PVD has many different causes. The most common cause of PVD is buildup of a fatty material (plaque) inside of your arteries  (atherosclerosis). Small amounts of plaque can break off from the walls of the blood vessels and become lodged in a smaller artery. This blocks blood flow and can cause acute ischemic limb. Other common causes of PVD include:  Blood clots that form inside of blood vessels.  Injuries to blood vessels.  Diseases that cause inflammation of blood vessels or cause blood vessel spasms.  Health behaviors and health history that increase your risk of developing PVD. RISK FACTORS  You may have a greater risk of PVD if you:  Have a family history of PVD.  Have certain medical conditions, including:  High cholesterol.  Diabetes.  High blood pressure (hypertension).  Coronary  heart disease.  Past problems with blood clots.  Past injury, such as burns or a broken bone. These may have damaged blood vessels in your limbs.  Buerger disease. This is caused by inflamed blood vessels in your hands and feet.  Some forms of arthritis.  Rare birth defects that affect the arteries in your legs.  Use tobacco.  Do not get enough exercise.  Are obese.  Are age 35 or older. SIGNS AND SYMPTOMS  PVD may cause many different symptoms. Your symptoms depend on what part of your body is not getting enough blood. Some common signs and symptoms include:  Cramps in your lower legs. This may be a symptom of poor leg circulation (claudication).  Pain and weakness in your legs while you are physically active that goes away when you rest (intermittent claudication).  Leg pain when at rest.  Leg numbness, tingling, or weakness.  Coldness in a leg or foot, especially when compared with the other leg.  Skin or hair changes. These can include:  Hair loss.  Shiny skin.  Pale or bluish skin.  Thick toenails.  Inability to get or maintain an erection (erectile dysfunction). People with PVD are more prone to developing ulcers and sores on their toes, feet, or legs. These may take longer than normal  to heal. DIAGNOSIS Your health care provider may diagnose PVD from your signs and symptoms. The health care provider will also do a physical exam. You may have tests to find out what is causing your PVD and determine its severity. Tests may include:  Blood pressure recordings from your arms and legs and measurements of the strength of your pulses (pulse volume recordings).  Imaging studies using sound waves to take pictures of the blood flow through your blood vessels (Doppler ultrasound).  Injecting a dye into your blood vessels before having imaging studies using:  X-rays (angiogram or arteriogram).  Computer-generated X-rays (CT angiogram).  A powerful electromagnetic field and a computer (magnetic resonance angiogram or MRA). TREATMENT Treatment for PVD depends on the cause of your condition and the severity of your symptoms. It also depends on your age. Underlying causes need to be treated and controlled. These include long-lasting (chronic) conditions, such as diabetes, high cholesterol, and high blood pressure. You may need to first try making lifestyle changes and taking medicines. Surgery may be needed if these do not work. Lifestyle changes may include:  Quitting smoking.  Exercising regularly.  Following a low-fat, low-cholesterol diet. Medicines may include:  Blood thinners to prevent blood clots.  Medicines to improve blood flow.  Medicines to improve your blood cholesterol levels. Surgical procedures may include:  A procedure that uses an inflated balloon to open a blocked artery and improve blood flow (angioplasty).  A procedure to put in a tube (stent) to keep a blocked artery open (stent implant).  Surgery to reroute blood flow around a blocked artery (peripheral bypass surgery).  Surgery to remove dead tissue from an infected wound on the affected limb.  Amputation. This is surgical removal of the affected limb. This may be necessary in cases of acute  ischemic limb that are not improved through medical or surgical treatments. HOME CARE INSTRUCTIONS  Take medicines only as directed by your health care provider.  Do not use any tobacco products, including cigarettes, chewing tobacco, or electronic cigarettes. If you need help quitting, ask your health care provider.  Lose weight if you are overweight, and maintain a healthy weight as directed by your health  care provider.  Eat a diet that is low in fat and cholesterol. If you need help, ask your health care provider.  Exercise regularly. Ask your health care provider to suggest some good activities for you.  Use compression stockings or other mechanical devices as directed by your health care provider.  Take good care of your feet.  Wear comfortable shoes that fit well.  Check your feet often for any cuts or sores. SEEK MEDICAL CARE IF:  You have cramps in your legs while walking.  You have leg pain when you are at rest.  You have coldness in a leg or foot.  Your skin changes.  You have erectile dysfunction.  You have cuts or sores on your feet that are not healing. SEEK IMMEDIATE MEDICAL CARE IF:  Your arm or leg turns cold and blue.  Your arms or legs become red, warm, swollen, painful, or numb.  You have chest pain or trouble breathing.  You suddenly have weakness in your face, arm, or leg.  You become very confused or lose the ability to speak.  You suddenly have a very bad headache or lose your vision.   This information is not intended to replace advice given to you by your health care provider. Make sure you discuss any questions you have with your health care provider.   Document Released: 10/27/2004 Document Revised: 10/10/2014 Document Reviewed: 02/27/2014 Elsevier Interactive Patient Education 2016 [employer].    Steps to Quit Smoking  Smoking tobacco can be harmful to your health and can affect almost every organ in your body. Smoking puts you,  and those around you, at risk for developing many serious chronic diseases. Quitting smoking is difficult, but it is one of the best things that you can do for your health. It is never too late to quit. WHAT ARE THE BENEFITS OF QUITTING SMOKING? When you quit smoking, you lower your risk of developing serious diseases and conditions, such as:  Lung cancer or lung disease, such as COPD.  Heart disease.  Stroke.  Heart attack.  Infertility.  Osteoporosis and bone fractures. Additionally, symptoms such as coughing, wheezing, and shortness of breath may get better when you quit. You may also find that you get sick less often because your body is stronger at fighting off colds and infections. If you are pregnant, quitting smoking can help to reduce your chances of having a baby of low birth weight. HOW DO I GET READY TO QUIT? When you decide to quit smoking, create a plan to make sure that you are successful. Before you quit:  Pick a date to quit. Set a date within the next two weeks to give you time to prepare.  Write down the reasons why you are quitting. Keep this list in places where you will see it often, such as on your bathroom mirror or in your car or wallet.  Identify the people, places, things, and activities that make you want to smoke (triggers) and avoid them. Make sure to take these actions:  Throw away all cigarettes at home, at work, and in your car.  Throw away smoking accessories, such as Scientist, research (medical).  Clean your car and make sure to empty the ashtray.  Clean your home, including curtains and carpets.  Tell your family, friends, and coworkers that you are quitting. Support from your loved ones can make quitting easier.  Talk with your health care provider about your options for quitting smoking.  Find out what treatment  options are covered by your health insurance. WHAT STRATEGIES CAN I USE TO QUIT SMOKING?  Talk with your healthcare provider about  different strategies to quit smoking. Some strategies include:  Quitting smoking altogether instead of gradually lessening how much you smoke over a period of time. Research shows that quitting "cold [place]" is more successful than gradually quitting.  Attending in-person counseling to help you build problem-solving skills. You are more likely to have success in quitting if you attend several counseling sessions. Even short sessions of 10 minutes can be effective.  Finding resources and support systems that can help you to quit smoking and remain smoke-free after you quit. These resources are most helpful when you use them often. They can include:  Online chats with a Social worker.  Telephone quitlines.  Printed Furniture conservator/restorer.  Support groups or group counseling.  Text messaging programs.  Mobile phone applications.  Taking medicines to help you quit smoking. (If you are pregnant or breastfeeding, talk with your health care provider first.) Some medicines contain nicotine and some do not. Both types of medicines help with cravings, but the medicines that include nicotine help to relieve withdrawal symptoms. Your health care provider may recommend:  Nicotine patches, gum, or lozenges.  Nicotine inhalers or sprays.  Non-nicotine medicine that is taken by mouth. Talk with your health care provider about combining strategies, such as taking medicines while you are also receiving in-person counseling. Using these two strategies together makes you more likely to succeed in quitting than if you used either strategy on its own. If you are pregnant or breastfeeding, talk with your health care provider about finding counseling or other support strategies to quit smoking. Do not take medicine to help you quit smoking unless told to do so by your health care provider. WHAT THINGS CAN I DO TO MAKE IT EASIER TO QUIT? Quitting smoking might feel overwhelming at first, but there is a lot that you  can do to make it easier. Take these important actions:  Reach out to your family and friends and ask that they support and encourage you during this time. Call telephone quitlines, reach out to support groups, or work with a counselor for support.  Ask people who smoke to avoid smoking around you.  Avoid places that trigger you to smoke, such as bars, parties, or smoke-break areas at work.  Spend time around people who do not smoke.  Lessen stress in your life, because stress can be a smoking trigger for some people. To lessen stress, try:  Exercising regularly.  Deep-breathing exercises.  Yoga.  Meditating.  Performing a body scan. This involves closing your eyes, scanning your body from head to toe, and noticing which parts of your body are particularly tense. Purposefully relax the muscles in those areas.  Download or purchase mobile phone or tablet apps (applications) that can help you stick to your quit plan by providing reminders, tips, and encouragement. There are many free apps, such as QuitGuide from the [employer] Office manager for Disease Control and Prevention). You can find other support for quitting smoking (smoking cessation) through smokefree.gov and other websites. HOW WILL I FEEL WHEN I QUIT SMOKING? Within the first 24 hours of quitting smoking, you may start to feel some withdrawal symptoms. These symptoms are usually most noticeable 2-3 days after quitting, but they usually do not last beyond 2-3 weeks. Changes or symptoms that you might experience include:  Mood swings.  Restlessness, anxiety, or irritation.  Difficulty concentrating.  Dizziness.  Strong cravings for sugary foods in addition to nicotine.  Mild weight gain.  Constipation.  Nausea.  Coughing or a sore throat.  Changes in how your medicines work in your body.  A depressed mood.  Difficulty sleeping (insomnia). After the first 2-3 weeks of quitting, you may start to notice more positive  results, such as:  Improved sense of smell and taste.  Decreased coughing and sore throat.  Slower heart rate.  Lower blood pressure.  Clearer skin.  The ability to breathe more easily.  Fewer sick days. Quitting smoking is very challenging for most people. Do not get discouraged if you are not successful the first time. Some people need to make many attempts to quit before they achieve long-term success. Do your best to stick to your quit plan, and talk with your health care provider if you have any questions or concerns.   This information is not intended to replace advice given to you by your health care provider. Make sure you discuss any questions you have with your health care provider.   Document Released: 09/13/2001 Document Revised: 02/03/2015 Document Reviewed: 02/03/2015 Elsevier Interactive Patient Education [employer].

## 2016-03-01 NOTE — Addendum Note (Signed)
Addended by: Mena Goes on: 03/01/2016 05:07 PM   Modules accepted: Orders

## 2016-03-10 DIAGNOSIS — E782 Mixed hyperlipidemia: Secondary | ICD-10-CM | POA: Diagnosis not present

## 2016-03-10 DIAGNOSIS — I1310 Hypertensive heart and chronic kidney disease without heart failure, with stage 1 through stage 4 chronic kidney disease, or unspecified chronic kidney disease: Secondary | ICD-10-CM | POA: Diagnosis not present

## 2016-03-10 DIAGNOSIS — E1349 Other specified diabetes mellitus with other diabetic neurological complication: Secondary | ICD-10-CM | POA: Diagnosis not present

## 2016-03-17 DIAGNOSIS — E1349 Other specified diabetes mellitus with other diabetic neurological complication: Secondary | ICD-10-CM | POA: Diagnosis not present

## 2016-03-17 DIAGNOSIS — I1310 Hypertensive heart and chronic kidney disease without heart failure, with stage 1 through stage 4 chronic kidney disease, or unspecified chronic kidney disease: Secondary | ICD-10-CM | POA: Diagnosis not present

## 2016-03-17 DIAGNOSIS — F17200 Nicotine dependence, unspecified, uncomplicated: Secondary | ICD-10-CM | POA: Diagnosis not present

## 2016-03-17 DIAGNOSIS — E782 Mixed hyperlipidemia: Secondary | ICD-10-CM | POA: Diagnosis not present

## 2016-04-06 DIAGNOSIS — Z6827 Body mass index (BMI) 27.0-27.9, adult: Secondary | ICD-10-CM | POA: Diagnosis not present

## 2016-04-06 DIAGNOSIS — F17200 Nicotine dependence, unspecified, uncomplicated: Secondary | ICD-10-CM | POA: Diagnosis not present

## 2016-04-06 DIAGNOSIS — J441 Chronic obstructive pulmonary disease with (acute) exacerbation: Secondary | ICD-10-CM | POA: Diagnosis not present

## 2016-05-02 DIAGNOSIS — F4310 Post-traumatic stress disorder, unspecified: Secondary | ICD-10-CM | POA: Diagnosis not present

## 2016-06-02 ENCOUNTER — Encounter: Payer: Self-pay | Admitting: Family Medicine

## 2016-07-19 DIAGNOSIS — E782 Mixed hyperlipidemia: Secondary | ICD-10-CM | POA: Diagnosis not present

## 2016-07-19 DIAGNOSIS — E1349 Other specified diabetes mellitus with other diabetic neurological complication: Secondary | ICD-10-CM | POA: Diagnosis not present

## 2016-07-19 DIAGNOSIS — I1310 Hypertensive heart and chronic kidney disease without heart failure, with stage 1 through stage 4 chronic kidney disease, or unspecified chronic kidney disease: Secondary | ICD-10-CM | POA: Diagnosis not present

## 2016-07-27 DIAGNOSIS — R7301 Impaired fasting glucose: Secondary | ICD-10-CM | POA: Diagnosis not present

## 2016-07-27 DIAGNOSIS — I1310 Hypertensive heart and chronic kidney disease without heart failure, with stage 1 through stage 4 chronic kidney disease, or unspecified chronic kidney disease: Secondary | ICD-10-CM | POA: Diagnosis not present

## 2016-07-27 DIAGNOSIS — R220 Localized swelling, mass and lump, head: Secondary | ICD-10-CM | POA: Diagnosis not present

## 2016-07-27 DIAGNOSIS — E782 Mixed hyperlipidemia: Secondary | ICD-10-CM | POA: Diagnosis not present

## 2016-08-30 DIAGNOSIS — E871 Hypo-osmolality and hyponatremia: Secondary | ICD-10-CM | POA: Diagnosis not present

## 2016-08-30 DIAGNOSIS — Z0000 Encounter for general adult medical examination without abnormal findings: Secondary | ICD-10-CM | POA: Diagnosis not present

## 2016-08-30 DIAGNOSIS — I1310 Hypertensive heart and chronic kidney disease without heart failure, with stage 1 through stage 4 chronic kidney disease, or unspecified chronic kidney disease: Secondary | ICD-10-CM | POA: Diagnosis not present

## 2016-08-30 DIAGNOSIS — Z1389 Encounter for screening for other disorder: Secondary | ICD-10-CM | POA: Diagnosis not present

## 2016-08-30 DIAGNOSIS — R05 Cough: Secondary | ICD-10-CM | POA: Diagnosis not present

## 2016-08-30 DIAGNOSIS — Z139 Encounter for screening, unspecified: Secondary | ICD-10-CM | POA: Diagnosis not present

## 2016-09-27 DIAGNOSIS — Z6827 Body mass index (BMI) 27.0-27.9, adult: Secondary | ICD-10-CM | POA: Diagnosis not present

## 2016-09-27 DIAGNOSIS — R05 Cough: Secondary | ICD-10-CM | POA: Diagnosis not present

## 2016-11-04 DIAGNOSIS — F4310 Post-traumatic stress disorder, unspecified: Secondary | ICD-10-CM | POA: Diagnosis not present

## 2016-11-08 DIAGNOSIS — K121 Other forms of stomatitis: Secondary | ICD-10-CM | POA: Diagnosis not present

## 2016-11-08 DIAGNOSIS — Z6827 Body mass index (BMI) 27.0-27.9, adult: Secondary | ICD-10-CM | POA: Diagnosis not present

## 2017-01-19 DIAGNOSIS — R7303 Prediabetes: Secondary | ICD-10-CM | POA: Diagnosis not present

## 2017-01-19 DIAGNOSIS — I10 Essential (primary) hypertension: Secondary | ICD-10-CM | POA: Diagnosis not present

## 2017-01-19 DIAGNOSIS — E782 Mixed hyperlipidemia: Secondary | ICD-10-CM | POA: Diagnosis not present

## 2017-01-26 DIAGNOSIS — I10 Essential (primary) hypertension: Secondary | ICD-10-CM | POA: Diagnosis not present

## 2017-01-26 DIAGNOSIS — I739 Peripheral vascular disease, unspecified: Secondary | ICD-10-CM | POA: Diagnosis not present

## 2017-01-26 DIAGNOSIS — R7303 Prediabetes: Secondary | ICD-10-CM | POA: Diagnosis not present

## 2017-01-26 DIAGNOSIS — E784 Other hyperlipidemia: Secondary | ICD-10-CM | POA: Diagnosis not present

## 2017-01-28 DIAGNOSIS — Z800 Family history of malignant neoplasm of digestive organs: Secondary | ICD-10-CM | POA: Diagnosis not present

## 2017-01-28 DIAGNOSIS — Z1379 Encounter for other screening for genetic and chromosomal anomalies: Secondary | ICD-10-CM | POA: Diagnosis not present

## 2017-02-07 ENCOUNTER — Ambulatory Visit: Payer: Medicare Other | Admitting: Family

## 2017-02-07 ENCOUNTER — Encounter (HOSPITAL_COMMUNITY): Payer: Medicare Other

## 2017-03-08 ENCOUNTER — Encounter: Payer: Self-pay | Admitting: Family

## 2017-03-10 ENCOUNTER — Encounter (HOSPITAL_COMMUNITY): Payer: Medicare Other

## 2017-03-10 ENCOUNTER — Ambulatory Visit (INDEPENDENT_AMBULATORY_CARE_PROVIDER_SITE_OTHER)
Admission: RE | Admit: 2017-03-10 | Discharge: 2017-03-10 | Disposition: A | Discharge status: Home / Self Care | Payer: Medicare Other | Source: Ambulatory Visit | Attending: Family | Admitting: Family

## 2017-03-10 ENCOUNTER — Ambulatory Visit (HOSPITAL_COMMUNITY)
Admission: RE | Admit: 2017-03-10 | Discharge: 2017-03-10 | Disposition: A | Discharge status: Home / Self Care | Payer: Medicare Other | Source: Ambulatory Visit | Attending: Family | Admitting: Family

## 2017-03-10 DIAGNOSIS — I10 Essential (primary) hypertension: Secondary | ICD-10-CM | POA: Diagnosis not present

## 2017-03-10 DIAGNOSIS — I6523 Occlusion and stenosis of bilateral carotid arteries: Secondary | ICD-10-CM | POA: Diagnosis not present

## 2017-03-10 DIAGNOSIS — F17200 Nicotine dependence, unspecified, uncomplicated: Secondary | ICD-10-CM | POA: Diagnosis not present

## 2017-03-10 DIAGNOSIS — I779 Disorder of arteries and arterioles, unspecified: Secondary | ICD-10-CM

## 2017-03-10 DIAGNOSIS — R938 Abnormal findings on diagnostic imaging of other specified body structures: Secondary | ICD-10-CM | POA: Diagnosis not present

## 2017-03-10 DIAGNOSIS — E1151 Type 2 diabetes mellitus with diabetic peripheral angiopathy without gangrene: Secondary | ICD-10-CM | POA: Diagnosis not present

## 2017-03-10 LAB — VAS US CAROTID
LCCADSYS: 109 cm/s
LEFT ECA DIAS: -15 cm/s
LEFT VERTEBRAL DIAS: -17 cm/s
LICADSYS: -107 cm/s
LICAPDIAS: -20 cm/s
Left CCA dist dias: 20 cm/s
Left CCA prox dias: 25 cm/s
Left CCA prox sys: 134 cm/s
Left ICA dist dias: -23 cm/s
Left ICA prox sys: -116 cm/s
RCCADSYS: -161 cm/s
RCCAPDIAS: 18 cm/s
RIGHT CCA MID DIAS: -22 cm/s
RIGHT ECA DIAS: -6 cm/s
RIGHT VERTEBRAL DIAS: -17 cm/s
Right CCA prox sys: 136 cm/s

## 2017-03-14 ENCOUNTER — Ambulatory Visit (HOSPITAL_COMMUNITY): Payer: Medicare Other

## 2017-03-14 ENCOUNTER — Ambulatory Visit (INDEPENDENT_AMBULATORY_CARE_PROVIDER_SITE_OTHER): Payer: Medicare Other | Admitting: Family

## 2017-03-14 ENCOUNTER — Encounter: Payer: Self-pay | Admitting: Family

## 2017-03-14 ENCOUNTER — Ambulatory Visit: Payer: Medicare Other | Admitting: Family

## 2017-03-14 VITALS — BP 155/80 | HR 74 | Temp 97.6°F | Resp 18 | Ht 62.0 in | Wt 154.0 lb

## 2017-03-14 DIAGNOSIS — Z959 Presence of cardiac and vascular implant and graft, unspecified: Secondary | ICD-10-CM | POA: Diagnosis not present

## 2017-03-14 DIAGNOSIS — I701 Atherosclerosis of renal artery: Secondary | ICD-10-CM | POA: Diagnosis not present

## 2017-03-14 DIAGNOSIS — I779 Disorder of arteries and arterioles, unspecified: Secondary | ICD-10-CM | POA: Diagnosis not present

## 2017-03-14 DIAGNOSIS — I6523 Occlusion and stenosis of bilateral carotid arteries: Secondary | ICD-10-CM | POA: Diagnosis not present

## 2017-03-14 DIAGNOSIS — F17200 Nicotine dependence, unspecified, uncomplicated: Secondary | ICD-10-CM | POA: Diagnosis not present

## 2017-03-14 NOTE — Patient Instructions (Addendum)
Stroke Prevention Some medical conditions and behaviors are associated with an increased chance of having a stroke. You may prevent a stroke by making healthy choices and managing medical conditions. How can I reduce my risk of having a stroke?  Stay physically active. Get at least 30 minutes of activity on most or all days.  Do not smoke. It may also be helpful to avoid exposure to secondhand smoke.  Limit alcohol use. Moderate alcohol use is considered to be: ? No more than 2 drinks per day for men. ? No more than 1 drink per day for nonpregnant women.  Eat healthy foods. This involves: ? Eating 5 or more servings of fruits and vegetables a day. ? Making dietary changes that address high blood pressure (hypertension), high cholesterol, diabetes, or obesity.  Manage your cholesterol levels. ? Making food choices that are high in fiber and low in saturated fat, trans fat, and cholesterol may control cholesterol levels. ? Take any prescribed medicines to control cholesterol as directed by your health care provider.  Manage your diabetes. ? Controlling your carbohydrate and sugar intake is recommended to manage diabetes. ? Take any prescribed medicines to control diabetes as directed by your health care provider.  Control your hypertension. ? Making food choices that are low in salt (sodium), saturated fat, trans fat, and cholesterol is recommended to manage hypertension. ? Ask your health care provider if you need treatment to lower your blood pressure. Take any prescribed medicines to control hypertension as directed by your health care provider. ? If you are 18-39 years of age, have your blood pressure checked every 3-5 years. If you are 40 years of age or older, have your blood pressure checked every year.  Maintain a healthy weight. ? Reducing calorie intake and making food choices that are low in sodium, saturated fat, trans fat, and cholesterol are recommended to manage  weight.  Stop drug abuse.  Avoid taking birth control pills. ? Talk to your health care provider about the risks of taking birth control pills if you are over 35 years old, smoke, get migraines, or have ever had a blood clot.  Get evaluated for sleep disorders (sleep apnea). ? Talk to your health care provider about getting a sleep evaluation if you snore a lot or have excessive sleepiness.  Take medicines only as directed by your health care provider. ? For some people, aspirin or blood thinners (anticoagulants) are helpful in reducing the risk of forming abnormal blood clots that can lead to stroke. If you have the irregular heart rhythm of atrial fibrillation, you should be on a blood thinner unless there is a good reason you cannot take them. ? Understand all your medicine instructions.  Make sure that other conditions (such as anemia or atherosclerosis) are addressed. Get help right away if:  You have sudden weakness or numbness of the face, arm, or leg, especially on one side of the body.  Your face or eyelid droops to one side.  You have sudden confusion.  You have trouble speaking (aphasia) or understanding.  You have sudden trouble seeing in one or both eyes.  You have sudden trouble walking.  You have dizziness.  You have a loss of balance or coordination.  You have a sudden, severe headache with no known cause.  You have new chest pain or an irregular heartbeat. Any of these symptoms may represent a serious problem that is an emergency. Do not wait to see if the symptoms will go away.   Get medical help at once. Call your local emergency services (911 in [place] Do not drive yourself to the hospital. This information is not intended to replace advice given to you by your health care provider. Make sure you discuss any questions you have with your health care provider. Document Released: 10/27/2004 Document Revised: 02/25/2016 Document Reviewed: 03/22/2013 Elsevier  Interactive Patient Education  2017 [place].     Renal Artery Stenosis Renal artery stenosis (RAS) is narrowing of the artery that carries blood to your kidneys. It can affect one or both kidneys. Your kidneys filter waste and extra fluid from your blood. You get rid of the waste and fluid when you urinate. Your kidneys also make an important chemical messenger (hormone) called renin. Renin helps regulate your blood pressure. The first sign of RAS may be high blood pressure. Over time, other symptoms can develop. What are the causes? Plaque buildup in your arteries (atherosclerosis) is the main cause of RAS. The plaques that cause this are made up of:  Fat.  Cholesterol.  Calcium.  Other substances.  As these substances build up in your renal artery, this slows the blood supply to your kidneys. The lack of blood and oxygen causes the signs and symptoms of RAS. A much less common cause of RAS is a disease called fibromuscular dysplasia. This disease causes abnormal cell growth that narrows the renal artery. It is not related to atherosclerosis. It occurs mostly in women who are 46-79 years old. It may be passed down through families. What increases the risk? You may be at risk for renal artery stenosis if you:  Are a man who is at least 71 years old.  Are a woman who is at least 71 years old.  Have high blood pressure.  Have high cholesterol.  Are a smoker.  Abuse alcohol.  Have diabetes or prediabetes.  Are overweight.  Have a family history of early heart disease.  What are the signs or symptoms? RAS usually develops slowly. You may not have any signs or symptoms at first. The earliest signs may be:  Developing high blood pressure.  A sudden increase in existing high blood pressure.  No longer responding to medicine that used to control your blood pressure.  Later signs and symptoms are due to kidney damage. They may include:  Fatigue.  Shortness of  breath.  Swollen legs and feet.  Dry skin.  Headaches.  Muscle cramps.  Loss of appetite.  Nausea or vomiting.  How is this diagnosed? Your health care provider may suspect RAS based on changes in your blood pressure and your risk factors. A physical exam will be done. Your health care provider may use a stethoscope to listen for a whooshing sound (bruit) that can occur where the renal artery is blocking blood flow. Several tests may be done to confirm a diagnosis of RAS. These may include:  Blood and urine tests to check your kidney function.  Imaging tests of your kidneys, such as: ? A test that involves using sound waves to create an image of your kidneys and the blood flow to your kidneys (ultrasound). ? A test in which dye is injected into one of your blood vessels so images can be taken as the dye flows through your renal arteries (angiogram). These tests can be done using X-rays, a CT scan (computed tomography angiogram, CTA), or a type of MRI (magnetic resonance angiogram, MRA).  How is this treated? Making lifestyle changes to reduce your risk factors  is the first treatment option for early RAS. If the blood flow to one of your kidneys is cut by more than half, you may need medicine to:  Lower your blood pressure. This is the main medical treatment for RAS. You may need more than one type of medicine for this. The two types that work best for RAS are: ? ACE inhibitors. ? Angiotensin receptor blockers.  Reduce fluid in the body (diuretics).  Lower your cholesterol (statins).  If medicine is not enough to control RAS, you may need surgery. This may involve:  Threading a tube with an inflatable balloon into the renal artery to force it open (angioplasty).  Removing plaque from inside the artery (endarterectomy).  Follow these instructions at home:  Take medicines only as directed by your health care provider.  Make any lifestyle changes recommended by your health  care provider. This may include: ? Working with a dietitian to maintain a heart-healthy diet. This type of diet is low in saturated fat, salt, and added sugar. ? Starting an exercise program as directed by your health care provider. ? Maintaining a healthy weight. ? Quitting smoking. ? Not abusing alcohol.  Keep all follow-up visits as directed by your health care provider. This is important. Contact a health care provider if:  Your symptoms of RAS are not getting better.  Your symptoms are changing or getting worse. Get help right away if:  You have very bad pain in your back or abdomen.  You have blood in your urine. This information is not intended to replace advice given to you by your health care provider. Make sure you discuss any questions you have with your health care provider. Document Released: 06/15/2005 Document Revised: 02/25/2016 Document Reviewed: 01/02/2014 Elsevier Interactive Patient Education  2018 [employer].      Peripheral Vascular Disease Peripheral vascular disease (PVD) is a disease of the blood vessels that are not part of your heart and brain. A simple term for PVD is poor circulation. In most cases, PVD narrows the blood vessels that carry blood from your heart to the rest of your body. This can result in a decreased supply of blood to your arms, legs, and internal organs, like your stomach or kidneys. However, it most often affects a person's lower legs and feet. There are two types of PVD.  Organic PVD. This is the more common type. It is caused by damage to the structure of blood vessels.  Functional PVD. This is caused by conditions that make blood vessels contract and tighten (spasm).  Without treatment, PVD tends to get worse over time. PVD can also lead to acute ischemic limb. This is when an arm or limb suddenly has trouble getting enough blood. This is a medical emergency. Follow these instructions at home:  Take medicines only as told by  your doctor.  Do not use any tobacco products, including cigarettes, chewing tobacco, or electronic cigarettes. If you need help quitting, ask your doctor.  Lose weight if you are overweight, and maintain a healthy weight as told by your doctor.  Eat a diet that is low in fat and cholesterol. If you need help, ask your doctor.  Exercise regularly. Ask your doctor for some good activities for you.  Take good care of your feet. ? Wear comfortable shoes that fit well. ? Check your feet often for any cuts or sores. Contact a doctor if:  You have cramps in your legs while walking.  You have leg pain when  you are at rest.  You have coldness in a leg or foot.  Your skin changes.  You are unable to get or have an erection (erectile dysfunction).  You have cuts or sores on your feet that are not healing. Get help right away if:  Your arm or leg turns cold and blue.  Your arms or legs become red, warm, swollen, painful, or numb.  You have chest pain or trouble breathing.  You suddenly have weakness in your face, arm, or leg.  You become very confused or you cannot speak.  You suddenly have a very bad headache.  You suddenly cannot see. This information is not intended to replace advice given to you by your health care provider. Make sure you discuss any questions you have with your health care provider. Document Released: 12/14/2009 Document Revised: 02/25/2016 Document Reviewed: 02/27/2014 Elsevier Interactive Patient Education  2017 [employer].      Steps to Quit Smoking Smoking tobacco can be bad for your health. It can also affect almost every organ in your body. Smoking puts you and people around you at risk for many serious long-lasting (chronic) diseases. Quitting smoking is hard, but it is one of the best things that you can do for your health. It is never too late to quit. What are the benefits of quitting smoking? When you quit smoking, you lower your risk for  getting serious diseases and conditions. They can include:  Lung cancer or lung disease.  Heart disease.  Stroke.  Heart attack.  Not being able to have children (infertility).  Weak bones (osteoporosis) and broken bones (fractures).  If you have coughing, wheezing, and shortness of breath, those symptoms may get better when you quit. You may also get sick less often. If you are pregnant, quitting smoking can help to lower your chances of having a baby of low birth weight. What can I do to help me quit smoking? Talk with your doctor about what can help you quit smoking. Some things you can do (strategies) include:  Quitting smoking totally, instead of slowly cutting back how much you smoke over a period of time.  Going to in-person counseling. You are more likely to quit if you go to many counseling sessions.  Using resources and support systems, such as: ? Database administrator with a Social worker. ? Phone quitlines. ? Careers information officer. ? Support groups or group counseling. ? Text messaging programs. ? Mobile phone apps or applications.  Taking medicines. Some of these medicines may have nicotine in them. If you are pregnant or breastfeeding, do not take any medicines to quit smoking unless your doctor says it is okay. Talk with your doctor about counseling or other things that can help you.  Talk with your doctor about using more than one strategy at the same time, such as taking medicines while you are also going to in-person counseling. This can help make quitting easier. What things can I do to make it easier to quit? Quitting smoking might feel very hard at first, but there is a lot that you can do to make it easier. Take these steps:  Talk to your family and friends. Ask them to support and encourage you.  Call phone quitlines, reach out to support groups, or work with a Social worker.  Ask people who smoke to not smoke around you.  Avoid places that make you want  (trigger) to smoke, such as: ? Bars. ? Parties. ? Smoke-break areas at work.  Spend time  with people who do not smoke.  Lower the stress in your life. Stress can make you want to smoke. Try these things to help your stress: ? Getting regular exercise. ? Deep-breathing exercises. ? Yoga. ? Meditating. ? Doing a body scan. To do this, close your eyes, focus on one area of your body at a time from head to toe, and notice which parts of your body are tense. Try to relax the muscles in those areas.  Download or buy apps on your mobile phone or tablet that can help you stick to your quit plan. There are many free apps, such as QuitGuide from the [employer] Office manager for Disease Control and Prevention). You can find more support from smokefree.gov and other websites.  This information is not intended to replace advice given to you by your health care provider. Make sure you discuss any questions you have with your health care provider. Document Released: 07/16/2009 Document Revised: 05/17/2016 Document Reviewed: 02/03/2015 Elsevier Interactive Patient Education  2018 [place] equips [place] smokers with tools to quit Smoking can wreak havoc on a person's body, but quitting can wreak havoc on a person's life, at least temporarily. Understanding of the challenge and the need to beat it, [facility], in partnership with [place] and the [facility] Department, is holding [place] classes. Smoking can wreak havoc on a person's body, but quitting can wreak havoc on a person's life, at least temporarily. Understanding of the challenge and the need to beat it, [facility], in partnership with [place] and the [facility] Department, is holding [place] classes. QuitSmart is a program developed to help people attack all the parts of quitting which present a  challenge, from breaking the addition to nicotine to overcoming the psychological dependence on cigarettes. Quitsmart has already successfully helped numerous [facility] smokers find freedom over the habit and many more sessions are already scheduled. [facility] has a high smoking rate, higher than the state average, according to the 2013 [place]. At the time of the assessment, [facility] had nearly 27 percent of residents were smokers, approaching a one-in-three ratio. Ellison Hughs, [facility] Employee Health Nurse and Pharmacologist, says she believes Iva Boop is a needed program. "If you smoke, your insurance automatically is higher," she says. "That's a big incentive to quit smoking." Two of the people who quit with [facility]'s class, she adds, told her they especially loved going home to houses that smelled great. Dyke Brackett kicked the habit with a Child psychotherapist class held through [employer]. He smoked his last cigarette June 22 of this year, one year to the day after he had surgery to remove two-thirds of one of his lungs as part of a lung cancer treatment. "I tried to quit while I was taking chemo and I tried to quit after chemo," Lily Peer says. "This time I knew - look, you can go on and smoke if you want to, but I watched my cousin lay over here in the bed and die a year and a half ago from cancer. He's only seven years older than me." QuitSmart classes are different. They're broken into three-part series' spread across two weeks and include take-home materials to help former smokers remain free. The first class lays everything out for participants, from what to expect to actually beginning the process of lessening their nicotine intake. "Anybody who's addicted to  anything is going to struggle the first few days, regardless of the process." Lily Peer says, but "by being able to smoke while we were wearing the patches, reducing the nicotine  level in our bodies gradually by changing brands to those with lower nicotine contents, when we quit, it wasn't such a shock to the body and the mind has been preparing for it for two weeks." Anyone can register, but Laurann Montana says being ready to make the decision to quit is very important. "When you sign up for this class, you need to make a firm decision to quit," says Laurann Montana. "There are two things that make this program work: a firm decision to quit and a willingness to try new thoughts and behaviors. Success depends on it." Classes have already been held or scheduled in [place] and [place] with many more coming in the near future. Additionally, [employer] just awarded [facility] a $5,000 grant to help develop QuitSmart even further. To see when upcoming QuitSmart classes are scheduled or to register, call [phone] or visit [email or web address] and click the "Events" tab.

## 2017-03-14 NOTE — Progress Notes (Signed)
[facility] [place] HISTORY AND PHYSICAL   MRN : [id number]  History of Present Illness:   Lindsey [name] is a 71 y.o. female patient of Dr. 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 Glade Nurse. Patient had aortobiiliac graft done at that time. She has known abnormal renal duplex scans of both renal arteries. She also has carotid occlusive disease which has been stable with moderate stenosis bilaterally and is asymptomatic. She has a heavy tobacco abuse history currently smoking a pack and half per day and has smoked for 50+ years.  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 She denies non healing wounds. Pt states pain in her right low back and hip has elevated her blood pressure. She has discussed this with her PCP. Pt states she has had low back and right hip pain for years, was taking long term Percocet or Darvocet for pain.  She denies any hx of stroke or TIA.  Pt Diabetic: Yes, most recent A1C was 5.8 on 03-10-16 according to lab results received from [name]; 01-19-17 labs received from same practice: serum creatinine: 1.12, total cholesterol: 306, triglycerides: 405, HDL: 34, unable to calculate LDL due to very elevated triglycerides.  Pt smoker: smoker  (1.5 ppd, started at age 68 yrs)  Pt meds include: Statin :No Betablocker: No ASA: Yes Other anticoagulants/antiplatelets: no    Current Outpatient Prescriptions  Medication Sig Dispense Refill  . ALPRAZolam (XANAX) 0.5 MG tablet Take 0.5 mg by mouth 3 (three) times daily as needed.    Marland Kitchen aspirin 325 MG tablet Take 325 mg by mouth daily.    Marland Kitchen ezetimibe (ZETIA) 10 MG tablet Take 10 mg by mouth daily.    . Multiple Vitamins-Iron (MULTIPLE VITAMIN/IRON PO) Take 1 tablet by mouth.    Marland Kitchen omeprazole  (PRILOSEC) 20 MG capsule Take 20 mg by mouth daily.    . valsartan (DIOVAN) 160 MG tablet Take 2 mg by mouth daily.     . Cholecalciferol (VITAMIN D3) 2000 units TABS Take by mouth.    . Multiple Vitamin (MULTIVITAMIN) tablet Take 1 tablet by mouth daily.    Marland Kitchen zolpidem (AMBIEN) 5 MG tablet Take 5 mg by mouth at bedtime as needed for sleep.     No current facility-administered medications for this visit.     Past Medical History:  Diagnosis Date  . Anxiety   . Arthritis   . Asthma   . COPD (chronic obstructive pulmonary disease) ([place])   . Fibromyalgia   . Hyperlipidemia   . Hypertension   . Prediabetes     Social History Social History  Substance Use Topics  . Smoking status: Current Every Day Smoker    Packs/day: 1.50    Types: Cigarettes  . Smokeless tobacco: Never Used  . Alcohol use No    Family History Family History  Problem Relation Age of Onset  . Diabetes Other   . Stroke Other     Surgical History Past Surgical History:  Procedure Laterality Date  . ABDOMINAL HYSTERECTOMY    . APPENDECTOMY  2009  . INCISIONAL HERNIA REPAIR    . PR VEIN BYPASS GRAFT,AORTO-FEM-POP  1992   by Dr. Scot Dock  . RENAL ARTERY STENT  2009   Left renal artery   done by Dr. Amedeo Plenty  Allergies  Allergen Reactions  . Advair Diskus [Fluticasone-Salmeterol]   . Amoxicillin   . Augmentin [Amoxicillin-Pot Clavulanate]   . Biaxin [Clarithromycin]   . Codeine   . Combivent [Ipratropium-Albuterol]   . Crestor [Rosuvastatin Calcium] Other (See Comments)    Myalgias  . Decongestant-Antihistamine [Triprolidine-Pseudoephedrine]   . Depo-Medrol [Methylprednisolone] Itching  . Ephedrine   . Flagyl [Metronidazole]   . Flexeril [Cyclobenzaprine]   . Flonase [Fluticasone Propionate]   . Hctz [Hydrochlorothiazide]     Fainting  . Lotrel [Amlodipine Besy-Benazepril Hcl]   . Macrobid [Nitrofurantoin]   . Meclizine   . Nasonex [Mometasone Furoate]   . Niaspan [Niacin]     Flushing,  sweating  . Norvasc [Amlodipine Besylate]   . Paxil [Paroxetine Hcl]   . Phenergan [Promethazine Hcl]   . Prednisone   . Red Dye     Headache, irritable, weakness  . Relafen [Nabumetone]   . Spiriva [Tiotropium Bromide Monohydrate]   . Sulfa Antibiotics     Edema, N&V  . Tequin [Gatifloxacin]   . Tramadol   . Transderm-Scop [Scopolamine]     Current Outpatient Prescriptions  Medication Sig Dispense Refill  . ALPRAZolam (XANAX) 0.5 MG tablet Take 0.5 mg by mouth 3 (three) times daily as needed.    Marland Kitchen aspirin 325 MG tablet Take 325 mg by mouth daily.    Marland Kitchen ezetimibe (ZETIA) 10 MG tablet Take 10 mg by mouth daily.    . Multiple Vitamins-Iron (MULTIPLE VITAMIN/IRON PO) Take 1 tablet by mouth.    Marland Kitchen omeprazole (PRILOSEC) 20 MG capsule Take 20 mg by mouth daily.    . valsartan (DIOVAN) 160 MG tablet Take 2 mg by mouth daily.     . Cholecalciferol (VITAMIN D3) 2000 units TABS Take by mouth.    . Multiple Vitamin (MULTIVITAMIN) tablet Take 1 tablet by mouth daily.    Marland Kitchen zolpidem (AMBIEN) 5 MG tablet Take 5 mg by mouth at bedtime as needed for sleep.     No current facility-administered medications for this visit.      REVIEW OF SYSTEMS: See HPI for pertinent positives and negatives.  Physical Examination Vitals:   03/14/17 1558 03/14/17 1601 03/14/17 1616 03/14/17 1617  BP: (!) 191/75 (!) 178/70 (!) 160/85 (!) 155/80  Pulse: 74     Resp: 18     Temp: 97.6 F (36.4 C)     TempSrc: Oral     SpO2: 92%     Weight: 154 lb (69.9 kg)     Height: 5\' 2"  (1.575 m)      Body mass index is 28.17 kg/m.  General:  Well nourished female in NAD, strong odor of cigarette smoke on her person and her friend with her Gait: antalgic Eyes: Pupils equal Pulmonary: normal non-labored breathing, rales, rhonchi, and wheezes in left posterior fields Cardiac: RRR, + murmur   Abdomen: soft, NT, no masses palpated Skin: no rashes, no ulcers, no cellulitis.   VASCULAR EXAM  Carotid Bruits Right  Left   Positive Positive         Bilateral radial pulses are 2+ palpable Abdominal aortic pulse is not palpable                     VASCULAR EXAM: Extremities without ischemic changes, without Gangrene; without open wounds.  LE Pulses Right Left       FEMORAL  1+ palpable  2+ palpable        POPLITEAL  not palpable   not palpable       POSTERIOR TIBIAL  2+ palpable   1+ palpable        DORSALIS PEDIS      ANTERIOR TIBIAL 1+ palpable  not palpable    Musculoskeletal: no muscle wasting or atrophy; no peripheral edema. Smooth raised mass left side of face, states her PCP is aware of this.         Neurologic: A&O X 3; Appropriate Affect; straight leg raise to resistance elicits pain in her right hip and low back; SENSATION: normal; MOTOR FUNCTION: 5/5 Symmetric, CN 2-12 intact; speech is fluent/normal      ASSESSMENT:  FLORA PARKS is a 71 y.o. female who had a left renal artery stent placed in 2009 and has severe aortoiliac occlusive disease, treated in 1992 by Dr. Glade Nurse. Patient had aortobiiliac graft done at that time. She has known abnormal renal duplex scans of both renal arteries. She also has carotid occlusive disease which has been stable with moderate stenosis bilaterally and is asymptomatic. She has a heavy tobacco abuse history currently smoking a pack and half per day and has smoked for 50+ years.  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 Straight leg raise to resistance elicits pain in her right hip and low back. She has no signs of ischemia in her feet/legs Pt states pain in her right low back and hip has elevated her blood pressure. She has discussed this with her PCP. Pt states she has had low back and right hip pain for years, was taking long term Percocet or  Darvocet for pain.  ABI's indicate moderate bilateral arterial occlusive disease with tri and biphasic waveforms. It is therefore unlikely that the severe pain she has had for years in her low back and right hip would be from lack of arterial perfusion.  She denies any hx of stroke or TIA.  Renal artery duplex from 01-04-16 shows the same amount of renal artery stenosis as the previous duplex indicated. She is only on one antihypertensive medication, her blood pressure is uncontrolled at this time.  I advised pt to see her PCP to address her elevated blood pressure. Also consider referral to a spine specialist or neurosurgeon for an evaluation of her lumbar spine and radiculopathy symptoms, will defer to pt's PCP since pt lives in [place] and states she prefers referral to a spine specialist in [place].   Pt states she did not have the renal duplex done as she could not be here at 0745 for this, but she works about 3 pm -11 pm and states she could not come in that early.    DATA  Carotid Duplex (03-10-2017): 40 - 59% right internal carotid artery stenosis. <40% left internal carotid artery stenosis. Bilateral vertebral artery flow is antegrade.  Bilateral subclavian artery waveforms are biphasic.  No significant change since exam of 01-27-17.   ABI (Date:03-10-2017):  R:   ABI: 0.71 (was 0.75 on 01-04-16),   PT: tri  DP: bi  TBI:  0.44 (was 0.55)  L:   ABI: 0.72 (was 0.74),   PT: tri  DP: bi             TBI: 0.37 (was 0.51) Mild decline in bilateral ABI and TBI.  Renal Artery Duplex: 01/04/16: Difficult exam due to multiple surgical procedures, scar tissue, hernia mesh implant. Known aortic stenosis makes renal aortic ratio invalid. Elevated velocities right renal artery c/w >60% diameter reduction. Elevated velocities in the left renal artery stent (297 cm/s proximal stent). Left kidney is below normal at 8.6 cm. Two cystic appearing structures on the right  kidney measuring 0.98 cm x 0.78 cm and 2.5 cm x 1.6 cm. No significant change compared to previous exam.  09-17-13 CTA abd/pelvis to evaluate renal artery stenosis, requested by Dr. Kellie Simmering: IMPRESSION: 1. Status post surgical aorto bi-iliac graft repair with a widely patent graft. 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 This is similar to the prior examination from 09/05/2008. 2. Left renal artery stent is grossly patent. Secondary to the small caliber of the stent and high density of the stent material, accurate assessment for in-stent restenosis is not possible on this examination. 3. Cholelithiasis without evidence to suggest acute cholecystitis at this time. 4. Colonic diverticulosis without evidence to suggest acute diverticulitis at this time.    PLAN:   The patient was counseled re smoking cessation and given several free resources re smoking cessation.  Pt is unable to participate in a graduated walking program to improve her lower extremity arterial perfusion due to severe low back and right hip pain with walking. I advised daily seated leg exercises which I discussed with the pt and her friend and demonstrated.   Based on today's exam and non-invasive vascular lab results, the patient will follow up in 1 year with the following tests: ABI's, bilateral renal artery duplex, and carotid duplex. This may be scheduled later in the day to accommodate her schedule, as long as she is NPO for at least 8 hours.    I discussed in depth with the patient the nature of atherosclerosis, and emphasized the importance of maximal medical management including strict control of blood pressure, blood glucose, and lipid levels, obtaining regular exercise, and cessation of smoking.  The patient is aware that without maximal medical management the  underlying atherosclerotic disease process will progress, limiting the benefit of any interventions.  The patient was given information about stroke prevention and what symptoms should prompt the patient to seek immediate medical care.  The patient was given information about PAD including signs, symptoms, treatment, what symptoms should prompt the patient to seek immediate medical care, and risk reduction measures to take.  Thank you for allowing [place] to participate in this patient's care.  Clemon Chambers, RN, MSN, FNP-C [facility] Office: [phone]  Clinic MD: Early 03/14/2017 4:20 PM

## 2017-03-16 ENCOUNTER — Encounter (HOSPITAL_COMMUNITY): Payer: Medicare Other

## 2017-03-23 ENCOUNTER — Encounter (HOSPITAL_COMMUNITY): Payer: Medicare Other

## 2017-03-24 NOTE — Addendum Note (Signed)
Addended by: Lianne Cure A on: 03/24/2017 02:37 PM   Modules accepted: Orders

## 2017-04-04 ENCOUNTER — Ambulatory Visit: Payer: Medicare Other | Admitting: Family

## 2017-04-04 ENCOUNTER — Encounter (HOSPITAL_COMMUNITY): Payer: Medicare Other

## 2017-04-14 DIAGNOSIS — Z1389 Encounter for screening for other disorder: Secondary | ICD-10-CM | POA: Diagnosis not present

## 2017-04-14 DIAGNOSIS — M79609 Pain in unspecified limb: Secondary | ICD-10-CM | POA: Diagnosis not present

## 2017-04-14 DIAGNOSIS — F3160 Bipolar disorder, current episode mixed, unspecified: Secondary | ICD-10-CM | POA: Diagnosis not present

## 2017-04-14 DIAGNOSIS — J42 Unspecified chronic bronchitis: Secondary | ICD-10-CM | POA: Diagnosis not present

## 2017-04-14 DIAGNOSIS — R0602 Shortness of breath: Secondary | ICD-10-CM | POA: Diagnosis not present

## 2017-04-14 DIAGNOSIS — F419 Anxiety disorder, unspecified: Secondary | ICD-10-CM | POA: Diagnosis not present

## 2017-04-14 DIAGNOSIS — Z139 Encounter for screening, unspecified: Secondary | ICD-10-CM | POA: Diagnosis not present

## 2017-04-14 DIAGNOSIS — Z720 Tobacco use: Secondary | ICD-10-CM | POA: Diagnosis not present

## 2017-04-28 DIAGNOSIS — Z6827 Body mass index (BMI) 27.0-27.9, adult: Secondary | ICD-10-CM | POA: Diagnosis not present

## 2017-04-28 DIAGNOSIS — J302 Other seasonal allergic rhinitis: Secondary | ICD-10-CM | POA: Diagnosis not present

## 2017-04-28 DIAGNOSIS — R05 Cough: Secondary | ICD-10-CM | POA: Diagnosis not present

## 2017-04-28 DIAGNOSIS — J305 Allergic rhinitis due to food: Secondary | ICD-10-CM | POA: Diagnosis not present

## 2017-05-01 DIAGNOSIS — J302 Other seasonal allergic rhinitis: Secondary | ICD-10-CM | POA: Diagnosis not present

## 2017-05-01 DIAGNOSIS — J305 Allergic rhinitis due to food: Secondary | ICD-10-CM | POA: Diagnosis not present

## 2017-05-01 DIAGNOSIS — F4310 Post-traumatic stress disorder, unspecified: Secondary | ICD-10-CM | POA: Diagnosis not present

## 2017-05-02 DIAGNOSIS — J305 Allergic rhinitis due to food: Secondary | ICD-10-CM | POA: Diagnosis not present

## 2017-05-02 DIAGNOSIS — J302 Other seasonal allergic rhinitis: Secondary | ICD-10-CM | POA: Diagnosis not present

## 2017-05-03 DIAGNOSIS — J302 Other seasonal allergic rhinitis: Secondary | ICD-10-CM | POA: Diagnosis not present

## 2017-05-03 DIAGNOSIS — J305 Allergic rhinitis due to food: Secondary | ICD-10-CM | POA: Diagnosis not present

## 2017-05-04 DIAGNOSIS — J305 Allergic rhinitis due to food: Secondary | ICD-10-CM | POA: Diagnosis not present

## 2017-05-04 DIAGNOSIS — J302 Other seasonal allergic rhinitis: Secondary | ICD-10-CM | POA: Diagnosis not present

## 2017-05-05 DIAGNOSIS — J302 Other seasonal allergic rhinitis: Secondary | ICD-10-CM | POA: Diagnosis not present

## 2017-05-05 DIAGNOSIS — J305 Allergic rhinitis due to food: Secondary | ICD-10-CM | POA: Diagnosis not present

## 2017-05-08 DIAGNOSIS — J302 Other seasonal allergic rhinitis: Secondary | ICD-10-CM | POA: Diagnosis not present

## 2017-05-08 DIAGNOSIS — J305 Allergic rhinitis due to food: Secondary | ICD-10-CM | POA: Diagnosis not present

## 2017-05-09 DIAGNOSIS — J302 Other seasonal allergic rhinitis: Secondary | ICD-10-CM | POA: Diagnosis not present

## 2017-05-09 DIAGNOSIS — J305 Allergic rhinitis due to food: Secondary | ICD-10-CM | POA: Diagnosis not present

## 2017-05-17 DIAGNOSIS — E119 Type 2 diabetes mellitus without complications: Secondary | ICD-10-CM | POA: Diagnosis not present

## 2017-05-17 DIAGNOSIS — H40053 Ocular hypertension, bilateral: Secondary | ICD-10-CM | POA: Diagnosis not present

## 2017-05-24 DIAGNOSIS — R7303 Prediabetes: Secondary | ICD-10-CM | POA: Diagnosis not present

## 2017-05-24 DIAGNOSIS — E782 Mixed hyperlipidemia: Secondary | ICD-10-CM | POA: Diagnosis not present

## 2017-05-24 DIAGNOSIS — I10 Essential (primary) hypertension: Secondary | ICD-10-CM | POA: Diagnosis not present

## 2017-05-30 DIAGNOSIS — E782 Mixed hyperlipidemia: Secondary | ICD-10-CM | POA: Diagnosis not present

## 2017-05-30 DIAGNOSIS — I10 Essential (primary) hypertension: Secondary | ICD-10-CM | POA: Diagnosis not present

## 2017-05-30 DIAGNOSIS — F17200 Nicotine dependence, unspecified, uncomplicated: Secondary | ICD-10-CM | POA: Diagnosis not present

## 2017-05-30 DIAGNOSIS — I6522 Occlusion and stenosis of left carotid artery: Secondary | ICD-10-CM | POA: Diagnosis not present

## 2017-05-30 DIAGNOSIS — R7303 Prediabetes: Secondary | ICD-10-CM | POA: Diagnosis not present

## 2017-06-28 DIAGNOSIS — H40053 Ocular hypertension, bilateral: Secondary | ICD-10-CM | POA: Diagnosis not present

## 2017-09-04 DIAGNOSIS — R7303 Prediabetes: Secondary | ICD-10-CM | POA: Diagnosis not present

## 2017-09-04 DIAGNOSIS — E782 Mixed hyperlipidemia: Secondary | ICD-10-CM | POA: Diagnosis not present

## 2017-09-04 DIAGNOSIS — I10 Essential (primary) hypertension: Secondary | ICD-10-CM | POA: Diagnosis not present

## 2017-09-13 DIAGNOSIS — E1369 Other specified diabetes mellitus with other specified complication: Secondary | ICD-10-CM | POA: Diagnosis not present

## 2017-09-13 DIAGNOSIS — Z139 Encounter for screening, unspecified: Secondary | ICD-10-CM | POA: Diagnosis not present

## 2017-09-13 DIAGNOSIS — E785 Hyperlipidemia, unspecified: Secondary | ICD-10-CM | POA: Diagnosis not present

## 2017-09-13 DIAGNOSIS — I10 Essential (primary) hypertension: Secondary | ICD-10-CM | POA: Diagnosis not present

## 2017-09-13 DIAGNOSIS — Z1331 Encounter for screening for depression: Secondary | ICD-10-CM | POA: Diagnosis not present

## 2017-09-13 DIAGNOSIS — E1121 Type 2 diabetes mellitus with diabetic nephropathy: Secondary | ICD-10-CM | POA: Diagnosis not present

## 2017-09-13 DIAGNOSIS — E1159 Type 2 diabetes mellitus with other circulatory complications: Secondary | ICD-10-CM | POA: Diagnosis not present

## 2017-09-13 DIAGNOSIS — Z0000 Encounter for general adult medical examination without abnormal findings: Secondary | ICD-10-CM | POA: Diagnosis not present

## 2017-09-20 DIAGNOSIS — H40053 Ocular hypertension, bilateral: Secondary | ICD-10-CM | POA: Diagnosis not present

## 2017-09-21 DIAGNOSIS — Z6827 Body mass index (BMI) 27.0-27.9, adult: Secondary | ICD-10-CM | POA: Diagnosis not present

## 2017-09-21 DIAGNOSIS — J411 Mucopurulent chronic bronchitis: Secondary | ICD-10-CM | POA: Diagnosis not present

## 2017-09-21 DIAGNOSIS — E875 Hyperkalemia: Secondary | ICD-10-CM | POA: Diagnosis not present

## 2017-09-21 DIAGNOSIS — Z7189 Other specified counseling: Secondary | ICD-10-CM | POA: Diagnosis not present

## 2017-10-25 DIAGNOSIS — F4310 Post-traumatic stress disorder, unspecified: Secondary | ICD-10-CM | POA: Diagnosis not present

## 2017-11-07 DIAGNOSIS — I160 Hypertensive urgency: Secondary | ICD-10-CM | POA: Diagnosis not present

## 2017-11-07 DIAGNOSIS — R011 Cardiac murmur, unspecified: Secondary | ICD-10-CM | POA: Diagnosis not present

## 2017-11-07 DIAGNOSIS — I517 Cardiomegaly: Secondary | ICD-10-CM | POA: Diagnosis not present

## 2017-11-07 DIAGNOSIS — R0989 Other specified symptoms and signs involving the circulatory and respiratory systems: Secondary | ICD-10-CM | POA: Diagnosis not present

## 2017-11-13 DIAGNOSIS — I160 Hypertensive urgency: Secondary | ICD-10-CM | POA: Diagnosis not present

## 2017-11-20 DIAGNOSIS — I160 Hypertensive urgency: Secondary | ICD-10-CM | POA: Diagnosis not present

## 2017-11-20 DIAGNOSIS — F17200 Nicotine dependence, unspecified, uncomplicated: Secondary | ICD-10-CM | POA: Diagnosis not present

## 2017-11-20 DIAGNOSIS — Z6827 Body mass index (BMI) 27.0-27.9, adult: Secondary | ICD-10-CM | POA: Diagnosis not present

## 2018-02-05 DIAGNOSIS — R7303 Prediabetes: Secondary | ICD-10-CM | POA: Diagnosis not present

## 2018-02-05 DIAGNOSIS — E782 Mixed hyperlipidemia: Secondary | ICD-10-CM | POA: Diagnosis not present

## 2018-02-05 DIAGNOSIS — I10 Essential (primary) hypertension: Secondary | ICD-10-CM | POA: Diagnosis not present

## 2018-02-21 DIAGNOSIS — J0110 Acute frontal sinusitis, unspecified: Secondary | ICD-10-CM | POA: Diagnosis not present

## 2018-02-21 DIAGNOSIS — I10 Essential (primary) hypertension: Secondary | ICD-10-CM | POA: Diagnosis not present

## 2018-02-21 DIAGNOSIS — E1159 Type 2 diabetes mellitus with other circulatory complications: Secondary | ICD-10-CM | POA: Diagnosis not present

## 2018-02-21 DIAGNOSIS — Z6827 Body mass index (BMI) 27.0-27.9, adult: Secondary | ICD-10-CM | POA: Diagnosis not present

## 2018-03-06 DIAGNOSIS — F419 Anxiety disorder, unspecified: Secondary | ICD-10-CM | POA: Diagnosis not present

## 2018-03-07 DIAGNOSIS — H40053 Ocular hypertension, bilateral: Secondary | ICD-10-CM | POA: Diagnosis not present

## 2018-03-08 DIAGNOSIS — E1159 Type 2 diabetes mellitus with other circulatory complications: Secondary | ICD-10-CM | POA: Diagnosis not present

## 2018-03-08 DIAGNOSIS — I10 Essential (primary) hypertension: Secondary | ICD-10-CM | POA: Diagnosis not present

## 2018-03-08 DIAGNOSIS — R05 Cough: Secondary | ICD-10-CM | POA: Diagnosis not present

## 2018-03-08 DIAGNOSIS — J411 Mucopurulent chronic bronchitis: Secondary | ICD-10-CM | POA: Diagnosis not present

## 2018-03-14 DIAGNOSIS — J989 Respiratory disorder, unspecified: Secondary | ICD-10-CM | POA: Diagnosis not present

## 2018-03-14 DIAGNOSIS — R05 Cough: Secondary | ICD-10-CM | POA: Diagnosis not present

## 2018-03-14 DIAGNOSIS — J449 Chronic obstructive pulmonary disease, unspecified: Secondary | ICD-10-CM | POA: Diagnosis not present

## 2018-03-20 ENCOUNTER — Ambulatory Visit (HOSPITAL_COMMUNITY)
Admission: RE | Admit: 2018-03-20 | Discharge: 2018-03-20 | Disposition: A | Discharge status: Home / Self Care | Payer: Medicare Other | Source: Ambulatory Visit | Attending: Vascular Surgery | Admitting: Vascular Surgery

## 2018-03-20 ENCOUNTER — Ambulatory Visit (INDEPENDENT_AMBULATORY_CARE_PROVIDER_SITE_OTHER)
Admission: RE | Admit: 2018-03-20 | Discharge: 2018-03-20 | Disposition: A | Discharge status: Home / Self Care | Payer: Medicare Other | Source: Ambulatory Visit | Attending: Vascular Surgery | Admitting: Vascular Surgery

## 2018-03-20 DIAGNOSIS — I779 Disorder of arteries and arterioles, unspecified: Secondary | ICD-10-CM | POA: Insufficient documentation

## 2018-03-20 DIAGNOSIS — N281 Cyst of kidney, acquired: Secondary | ICD-10-CM | POA: Insufficient documentation

## 2018-03-20 DIAGNOSIS — Q272 Other congenital malformations of renal artery: Secondary | ICD-10-CM | POA: Insufficient documentation

## 2018-03-20 DIAGNOSIS — I701 Atherosclerosis of renal artery: Secondary | ICD-10-CM | POA: Insufficient documentation

## 2018-03-20 DIAGNOSIS — I739 Peripheral vascular disease, unspecified: Secondary | ICD-10-CM | POA: Insufficient documentation

## 2018-03-23 ENCOUNTER — Ambulatory Visit (INDEPENDENT_AMBULATORY_CARE_PROVIDER_SITE_OTHER): Payer: Medicare Other | Admitting: Family

## 2018-03-23 ENCOUNTER — Ambulatory Visit (HOSPITAL_COMMUNITY)
Admission: RE | Admit: 2018-03-23 | Discharge: 2018-03-23 | Disposition: A | Discharge status: Home / Self Care | Payer: Medicare Other | Source: Ambulatory Visit | Attending: Family | Admitting: Family

## 2018-03-23 ENCOUNTER — Encounter: Payer: Self-pay | Admitting: Family

## 2018-03-23 VITALS — BP 225/70 | HR 102 | Resp 18 | Ht 62.0 in | Wt 146.5 lb

## 2018-03-23 DIAGNOSIS — I779 Disorder of arteries and arterioles, unspecified: Secondary | ICD-10-CM

## 2018-03-23 DIAGNOSIS — Z959 Presence of cardiac and vascular implant and graft, unspecified: Secondary | ICD-10-CM

## 2018-03-23 DIAGNOSIS — I10 Essential (primary) hypertension: Secondary | ICD-10-CM | POA: Diagnosis not present

## 2018-03-23 DIAGNOSIS — I6523 Occlusion and stenosis of bilateral carotid arteries: Secondary | ICD-10-CM | POA: Diagnosis not present

## 2018-03-23 DIAGNOSIS — F17200 Nicotine dependence, unspecified, uncomplicated: Secondary | ICD-10-CM | POA: Diagnosis not present

## 2018-03-23 DIAGNOSIS — I701 Atherosclerosis of renal artery: Secondary | ICD-10-CM | POA: Diagnosis not present

## 2018-03-23 NOTE — Progress Notes (Signed)
[facility] [place] HISTORY AND PHYSICAL   CC: Follow up renal artery stenosis, peripheral artery occlusive disease, and extracranial carotid artery stenosis    History of Present Illness:   Lindsey [name] is a 72 y.o. female whom Dr. 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 Glade Nurse. Patient had aortobiiliac graft done at that time. She has known abnormal renal duplex scans of both renal arteries. She also has carotid occlusive disease which has been stable with moderate stenosis bilaterally and is asymptomatic. She has a heavy tobacco abuse history currently smoking 1-2 ppd and has smoked since age 43.   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 She denies non healing wounds in her feet or legs.. Pt states pain in her right low back and hip has elevated her blood pressure. She has discussed this with her PCP. Pt states she has had low back and right hip pain for years, was taking long term Percocet or Darvocet for pain.  She denies any hx of stroke or TIA.  She states her hypertension medications are being changed.  She is seeing a mental health specialist, "I'm trying to come off nerve tablets".  She has a lump on the left side of her face, is not painful, pt states her PCP knows about this.   Pt denies headache, denies dyspnea, denies chest pain.  She states she forgot to take her blood pressure medication today.   Pt Diabetic: Yes, most recent A1C was 5.8 on 03-10-16 according to lab results received from [name]; 01-19-17 labs received from same practice: serum creatinine: 1.12, total cholesterol: 306, triglycerides: 405, HDL: 34, unable to calculate LDL due to very elevated triglycerides.  Pt smoker: smoker (1-2 ppd, started at age 89 yrs)  Pt meds include: Statin  :No Betablocker: No ASA: Yes Other anticoagulants/antiplatelets: no    Current Outpatient Medications  Medication Sig Dispense Refill  . ALPRAZolam (XANAX) 0.5 MG tablet Take 0.5 mg by mouth 3 (three) times daily as needed.    Marland Kitchen aspirin 325 MG tablet Take 325 mg by mouth daily.    . Cholecalciferol (VITAMIN D3) 2000 units TABS Take by mouth.    . ezetimibe (ZETIA) 10 MG tablet Take 10 mg by mouth daily.    . Multiple Vitamin (MULTIVITAMIN) tablet Take 1 tablet by mouth daily.    . Multiple Vitamins-Iron (MULTIPLE VITAMIN/IRON PO) Take 1 tablet by mouth.    Marland Kitchen omeprazole (PRILOSEC) 20 MG capsule Take 20 mg by mouth daily.    . valsartan (DIOVAN) 160 MG tablet Take 2 mg by mouth daily.     Marland Kitchen zolpidem (AMBIEN) 5 MG tablet Take 5 mg by mouth at bedtime as needed for sleep.     No current facility-administered medications for this visit.     Past Medical History:  Diagnosis Date  . Anxiety   . Arthritis   . Asthma   . COPD (chronic obstructive pulmonary disease) ([place])   . Fibromyalgia   . Hyperlipidemia   . Hypertension   . Prediabetes     Social History Social History   Tobacco Use  . Smoking status: Current Every Day Smoker    Packs/day: 1.50    Types: Cigarettes  . Smokeless tobacco: Never Used  Substance Use Topics  . Alcohol use: No  Alcohol/week: 0.0 oz  . Drug use: No    Family History Family History  Problem Relation Age of Onset  . Diabetes Other   . Stroke Other     Surgical History Past Surgical History:  Procedure Laterality Date  . ABDOMINAL HYSTERECTOMY    . APPENDECTOMY  2009  . INCISIONAL HERNIA REPAIR    . PR VEIN BYPASS GRAFT,AORTO-FEM-POP  1992   by Dr. Scot Dock  . RENAL ARTERY STENT  2009   Left renal artery   done by Dr. Amedeo Plenty    Allergies  Allergen Reactions  . Advair Diskus [Fluticasone-Salmeterol]   . Amoxicillin   . Augmentin [Amoxicillin-Pot Clavulanate]   . Biaxin [Clarithromycin]   . Codeine   . Combivent  [Ipratropium-Albuterol]   . Crestor [Rosuvastatin Calcium] Other (See Comments)    Myalgias  . Decongestant-Antihistamine [Triprolidine-Pseudoephedrine]   . Depo-Medrol [Methylprednisolone] Itching  . Ephedrine   . Flagyl [Metronidazole]   . Flexeril [Cyclobenzaprine]   . Flonase [Fluticasone Propionate]   . Hctz [Hydrochlorothiazide]     Fainting  . Lotrel [Amlodipine Besy-Benazepril Hcl]   . Macrobid [Nitrofurantoin]   . Meclizine   . Nasonex [Mometasone Furoate]   . Niaspan [Niacin]     Flushing, sweating  . Norvasc [Amlodipine Besylate]   . Paxil [Paroxetine Hcl]   . Phenergan [Promethazine Hcl]   . Prednisone   . Red Dye     Headache, irritable, weakness  . Relafen [Nabumetone]   . Spiriva [Tiotropium Bromide Monohydrate]   . Sulfa Antibiotics     Edema, N&V  . Tequin [Gatifloxacin]   . Tramadol   . Transderm-Scop [Scopolamine]     Current Outpatient Medications  Medication Sig Dispense Refill  . ALPRAZolam (XANAX) 0.5 MG tablet Take 0.5 mg by mouth 3 (three) times daily as needed.    Marland Kitchen aspirin 325 MG tablet Take 325 mg by mouth daily.    . Cholecalciferol (VITAMIN D3) 2000 units TABS Take by mouth.    . ezetimibe (ZETIA) 10 MG tablet Take 10 mg by mouth daily.    . Multiple Vitamin (MULTIVITAMIN) tablet Take 1 tablet by mouth daily.    . Multiple Vitamins-Iron (MULTIPLE VITAMIN/IRON PO) Take 1 tablet by mouth.    Marland Kitchen omeprazole (PRILOSEC) 20 MG capsule Take 20 mg by mouth daily.    . valsartan (DIOVAN) 160 MG tablet Take 2 mg by mouth daily.     Marland Kitchen zolpidem (AMBIEN) 5 MG tablet Take 5 mg by mouth at bedtime as needed for sleep.     No current facility-administered medications for this visit.      REVIEW OF SYSTEMS: See HPI for pertinent positives and negatives.  Physical Examination Vitals:   03/23/18 1532 03/23/18 1536  BP: (!) 227/74 (!) 225/70  Pulse: (!) 102   Resp: 18   SpO2: 95%   Weight: 146 lb 8 oz (66.5 kg)   Height: 5\' 2"  (1.575 m)    Body mass  index is 26.8 kg/m.  General:  Well nourished female in NAD, strong odor of cigarette smoke on her person and her partner with her Gait: antalgic HENT:  Smooth raised mass left side of face, states her PCP is aware of this. Eyes: PERRLA Pulmonary: slightly labored breathing at rest, diminished air movement in all fields,+rales, rhonchi, and wheezes in left posterior fields Cardiac: Regular rhythm and rate, + murmur  Abdomen: soft, NT, no masses palpated Skin: no rashes, no ulcers, no cellulitis.   VASCULAR EXAM  Carotid Bruits  Right Left   Positive Positive   Bilateral radial pulses are 2+ palpable Abdominal aortic pulse is not palpable   VASCULAR EXAM: Extremitieswithoutischemic changes, withoutgangrene; withoutopen wounds.  LE Pulses Right Left  FEMORAL 2+ palpable 2+ palpable   POPLITEAL not palpable  not palpable  POSTERIOR TIBIAL 1+ palpable  not palpable   DORSALIS PEDIS ANTERIOR TIBIAL not palpable  not palpable    Musculoskeletal: no muscle wasting or atrophy; no peripheral edema. Neurologic:A&O X 3; Appropriate Affect; straight leg raise to resistance elicits pain in her right hip and low back; SENSATION: normal; MOTOR FUNCTION: 5/5 Symmetric except left LE at 4/5, CN 2-12 intact; speech is fluent/normal.  Psychiatric: Normal thought content, mood appropriate to clinical situation. Loqacious, rambling, unfocused.      ASSESSMENT:  Lindsey [name] is a 72 y.o. female who had a left renal artery stent placed in 2009 and has severe aortoiliac occlusive disease, treated in 1992 by Dr. Glade Nurse. Patient had aortobiiliac graft done at that time. She has known abnormal renal duplex scans of both renal arteries. She also has carotid occlusive disease which has been stable with moderate stenosis bilaterally and is asymptomatic. She has a heavy tobacco abuse history currently smoking a pack  and half per day and has smoked since age 42.   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 Straight leg raise to resistance elicits pain in her right hip and low back. She has no signs of ischemia in her feet/legs Pt states pain in her right low back and hip. She has discussed this with her PCP. Pt states she has had low back and right hip pain for years, was taking long term Percocet or Darvocet for pain.  ABI's indicate moderate bilateral arterial occlusive disease with tri and biphasic waveforms. It is therefore unlikelythat the severe pain she has had for years in her low back and right hip would be from lack of arterial perfusion.  She denies any hx of stroke or TIA.  Renal artery duplex from 01-04-16 shows the same amount of renal artery stenosis as the previous duplex indicated. She is only on one antihypertensive medication, her blood pressure is uncontrolled at this time.  I advised her to call her PCP today and ask her advice re her uncontrolled hypertension. Pt denies headache, denies dyspnea, denies chest pain.  She states she forgot to take her blood pressure medication today.    Also consider referral to a spine specialist or neurosurgeon for an evaluation of her lumbar spine and radiculopathy symptoms, will defer to pt's PCP since pt lives in [place] and states she prefers referral to a spine specialist in [place].   Dr. Donzetta Matters spoke with pt and her partner and examined pt. He offered aortogram to evaluate and attempt to decrease the stenosis of her renal arteries. Pt states she would think about it, and let us know.  Last serum creatinine result on file was 1.25 on 02-05-18, results from Dr. Marco Collie.     DATA  Carotid Duplex (03-23-18): Limited exam due to patient coughing.  Right ICA: 40-59% stenosis Left ICA: 1-39% stenosis Right ECA: >50% stenosis Bilateral vertebral artery flow is antegrade.  Bilateral subclavian  artery waveforms are normal.  No significant change since exams of 01-27-17 and 03-10-17.   Renal Artery Duplex (03-20-18): Limited visualization due to pt not NPO, air/bowel gas, and patient discomfort.  Prior renal artery duplex performed 01/04/2016 showed  aortic stenosis, elevated peak systolic velocities in the renal arteries bilaterally (left renal artery stent (297 cm/s proximal stent), decreased left kidney size, and renal cysts.  Right renal artery with 448 cm/s PSV at proximal segment.  Left renal artery with 324 cm/s PSV at proximal segment. Could not directly visualize the left renal artery stent.  Aortic peak systolic velocity was 007 cm/sec rendering renal to aortic ratios irrelevant. Right kidney had two significant cysts, upper pole 2.0 x. 2.1 cm and mid 1.3 cm x 1.8 cm.   Right: Normal size right kidney. Evidence of a greater than 60% stenosis of the right renal artery. Cyst(s) noted. RRV flow    present. Left: Abnormal size left renal artery. Evidence of a > 60% stenosis in the left renal artery. LRV flow present.   09-17-13 CTA abd/pelvis to evaluate renal artery stenosis, requested by Dr. Kellie Simmering: IMPRESSION: 1. Status post surgical aorto bi-iliac graft repair with a widely patent graft. 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 This is similar to the prior examination from 09/05/2008. 2. Left renal artery stent is grossly patent. Secondary to the small caliber of the stent and high density of the stent material, accurate assessment for in-stent restenosis is not possible on this examination. 3. Cholelithiasis without evidence to suggest acute cholecystitis at this time. 4. Colonic diverticulosis without evidence to suggest acute diverticulitis at this time.     ABI (Date: 03-20-18):  R:   ABI: 0.76 (was 0.71 on  03-10-17),   PT: bi  DP: mono  TBI:  0.41 (was 0.44)  L:   ABI: 0.67 (was 0.72),   PT: bi  DP: mono  TBI: 0.46 (was 0.37)  Stable bilateral ABI sand TBI with bi and monophasic waveforms, moderate disease bilaterally.    PLAN:   Based on today's exam and non-invasive vascular lab results, the patient will follow up in 1 year with the following tests: carotid duplex and ABI's.  The patient was counseled re smoking cessation and given several free resources re smoking cessation.  Pt is unable to participate in a graduated walking program to improve her lower extremity arterial perfusion due to severe low back and right hip pain with walking. I advised daily seated leg exercises which I discussed with the pt and her friend and demonstrated.   I discussed in depth with the patient the nature of atherosclerosis, and emphasized the importance of maximal medical management including strict control of blood pressure, blood glucose, and lipid levels, obtaining regular exercise, and cessation of smoking.  The patient is aware that without maximal medical management the underlying atherosclerotic disease process will progress, limiting the benefit of any interventions.  The patient was given information about stroke prevention and what symptoms should prompt the patient to seek immediate medical care.  The patient was given information about PAD including signs, symptoms, treatment, what symptoms should prompt the patient to seek immediate medical care, and risk reduction measures to take.  Thank you for allowing [place] to participate in this patient's care.  Clemon Chambers, RN, MSN, FNP-C [facility] Office: [phone]  Clinic MD: Donzetta Matters 03/23/2018 3:49 PM

## 2018-03-23 NOTE — Patient Instructions (Addendum)
Steps to Quit Smoking Smoking tobacco can be bad for your health. It can also affect almost every organ in your body. Smoking puts you and people around you at risk for many serious long-lasting (chronic) diseases. Quitting smoking is hard, but it is one of the best things that you can do for your health. It is never too late to quit. What are the benefits of quitting smoking? When you quit smoking, you lower your risk for getting serious diseases and conditions. They can include:  Lung cancer or lung disease.  Heart disease.  Stroke.  Heart attack.  Not being able to have children (infertility).  Weak bones (osteoporosis) and broken bones (fractures).  If you have coughing, wheezing, and shortness of breath, those symptoms may get better when you quit. You may also get sick less often. If you are pregnant, quitting smoking can help to lower your chances of having a baby of low birth weight. What can I do to help me quit smoking? Talk with your doctor about what can help you quit smoking. Some things you can do (strategies) include:  Quitting smoking totally, instead of slowly cutting back how much you smoke over a period of time.  Going to in-person counseling. You are more likely to quit if you go to many counseling sessions.  Using resources and support systems, such as: ? Online chats with a counselor. ? Phone quitlines. ? Printed self-help materials. ? Support groups or group counseling. ? Text messaging programs. ? Mobile phone apps or applications.  Taking medicines. Some of these medicines may have nicotine in them. If you are pregnant or breastfeeding, do not take any medicines to quit smoking unless your doctor says it is okay. Talk with your doctor about counseling or other things that can help you.  Talk with your doctor about using more than one strategy at the same time, such as taking medicines while you are also going to in-person counseling. This can help make  quitting easier. What things can I do to make it easier to quit? Quitting smoking might feel very hard at first, but there is a lot that you can do to make it easier. Take these steps:  Talk to your family and friends. Ask them to support and encourage you.  Call phone quitlines, reach out to support groups, or work with a counselor.  Ask people who smoke to not smoke around you.  Avoid places that make you want (trigger) to smoke, such as: ? Bars. ? Parties. ? Smoke-break areas at work.  Spend time with people who do not smoke.  Lower the stress in your life. Stress can make you want to smoke. Try these things to help your stress: ? Getting regular exercise. ? Deep-breathing exercises. ? Yoga. ? Meditating. ? Doing a body scan. To do this, close your eyes, focus on one area of your body at a time from head to toe, and notice which parts of your body are tense. Try to relax the muscles in those areas.  Download or buy apps on your mobile phone or tablet that can help you stick to your quit plan. There are many free apps, such as QuitGuide from the CDC (Centers for Disease Control and Prevention). You can find more support from smokefree.gov and other websites.  This information is not intended to replace advice given to you by your health care provider. Make sure you discuss any questions you have with your health care provider. Document Released: 07/16/2009 Document   Revised: 05/17/2016 Document Reviewed: 02/03/2015 Elsevier Interactive Patient Education  2018 Elsevier Inc.     Peripheral Vascular Disease Peripheral vascular disease (PVD) is a disease of the blood vessels that are not part of your heart and brain. A simple term for PVD is poor circulation. In most cases, PVD narrows the blood vessels that carry blood from your heart to the rest of your body. This can result in a decreased supply of blood to your arms, legs, and internal organs, like your stomach or kidneys.  However, it most often affects a person's lower legs and feet. There are two types of PVD.  Organic PVD. This is the more common type. It is caused by damage to the structure of blood vessels.  Functional PVD. This is caused by conditions that make blood vessels contract and tighten (spasm).  Without treatment, PVD tends to get worse over time. PVD can also lead to acute ischemic limb. This is when an arm or limb suddenly has trouble getting enough blood. This is a medical emergency. Follow these instructions at home:  Take medicines only as told by your doctor.  Do not use any tobacco products, including cigarettes, chewing tobacco, or electronic cigarettes. If you need help quitting, ask your doctor.  Lose weight if you are overweight, and maintain a healthy weight as told by your doctor.  Eat a diet that is low in fat and cholesterol. If you need help, ask your doctor.  Exercise regularly. Ask your doctor for some good activities for you.  Take good care of your feet. ? Wear comfortable shoes that fit well. ? Check your feet often for any cuts or sores. Contact a doctor if:  You have cramps in your legs while walking.  You have leg pain when you are at rest.  You have coldness in a leg or foot.  Your skin changes.  You are unable to get or have an erection (erectile dysfunction).  You have cuts or sores on your feet that are not healing. Get help right away if:  Your arm or leg turns cold and blue.  Your arms or legs become red, warm, swollen, painful, or numb.  You have chest pain or trouble breathing.  You suddenly have weakness in your face, arm, or leg.  You become very confused or you cannot speak.  You suddenly have a very bad headache.  You suddenly cannot see. This information is not intended to replace advice given to you by your health care provider. Make sure you discuss any questions you have with your health care provider. Document Released:  12/14/2009 Document Revised: 02/25/2016 Document Reviewed: 02/27/2014 Elsevier Interactive Patient Education  2017 Elsevier Inc.     Stroke Prevention Some health problems and behaviors may make it more likely for you to have a stroke. Below are ways to lessen your risk of having a stroke.  Be active for at least 30 minutes on most or all days.  Do not smoke. Try not to be around others who smoke.  Do not drink too much alcohol. ? Do not have more than 2 drinks a day if you are a man. ? Do not have more than 1 drink a day if you are a woman and are not pregnant.  Eat healthy foods, such as fruits and vegetables. If you were put on a specific diet, follow the diet as told.  Keep your cholesterol levels under control through diet and medicines. Look for foods that are low in   saturated fat, trans fat, cholesterol, and are high in fiber.  If you have diabetes, follow all diet plans and take your medicine as told.  Ask your doctor if you need treatment to lower your blood pressure. If you have high blood pressure (hypertension), follow all diet plans and take your medicine as told by your doctor.  If you are 18-39 years old, have your blood pressure checked every 3-5 years. If you are age 40 or older, have your blood pressure checked every year.  Keep a healthy weight. Eat foods that are low in calories, salt, saturated fat, trans fat, and cholesterol.  Do not take drugs.  Avoid birth control pills, if this applies. Talk to your doctor about the risks of taking birth control pills.  Talk to your doctor if you have sleep problems (sleep apnea).  Take all medicine as told by your doctor. ? You may be told to take aspirin or blood thinner medicine. Take this medicine as told by your doctor. ? Understand your medicine instructions.  Make sure any other conditions you have are being taken care of.  Get help right away if:  You suddenly lose feeling (you feel numb) or have weakness  in your face, arm, or leg.  Your face or eyelid hangs down to one side.  You suddenly feel confused.  You have trouble talking (aphasia) or understanding what people are saying.  You suddenly have trouble seeing in one or both eyes.  You suddenly have trouble walking.  You are dizzy.  You lose your balance or your movements are clumsy (uncoordinated).  You suddenly have a very bad headache and you do not know the cause.  You have new chest pain.  Your heart feels like it is fluttering or skipping a beat (irregular heartbeat). Do not wait to see if the symptoms above go away. Get help right away. Call your local emergency services (911 in [place] Do not drive yourself to the hospital. This information is not intended to replace advice given to you by your health care provider. Make sure you discuss any questions you have with your health care provider. Document Released: 03/20/2012 Document Revised: 02/25/2016 Document Reviewed: 03/22/2013 Elsevier Interactive Patient Education  2018 Elsevier Inc.  

## 2018-03-26 ENCOUNTER — Encounter: Payer: Self-pay | Admitting: Family Medicine

## 2018-04-09 DIAGNOSIS — F17200 Nicotine dependence, unspecified, uncomplicated: Secondary | ICD-10-CM | POA: Diagnosis not present

## 2018-04-09 DIAGNOSIS — N289 Disorder of kidney and ureter, unspecified: Secondary | ICD-10-CM | POA: Diagnosis not present

## 2018-04-09 DIAGNOSIS — I151 Hypertension secondary to other renal disorders: Secondary | ICD-10-CM | POA: Diagnosis not present

## 2018-04-09 DIAGNOSIS — I701 Atherosclerosis of renal artery: Secondary | ICD-10-CM | POA: Diagnosis not present

## 2018-04-09 DIAGNOSIS — J411 Mucopurulent chronic bronchitis: Secondary | ICD-10-CM | POA: Diagnosis not present

## 2018-04-17 ENCOUNTER — Other Ambulatory Visit: Payer: Self-pay | Admitting: *Deleted

## 2018-04-23 ENCOUNTER — Other Ambulatory Visit: Payer: Self-pay | Admitting: Vascular Surgery

## 2018-04-25 DIAGNOSIS — F419 Anxiety disorder, unspecified: Secondary | ICD-10-CM | POA: Diagnosis not present

## 2018-04-29 ENCOUNTER — Other Ambulatory Visit: Payer: Self-pay

## 2018-04-29 ENCOUNTER — Encounter (HOSPITAL_COMMUNITY): Payer: Self-pay | Admitting: Certified Registered Nurse Anesthetist

## 2018-04-29 ENCOUNTER — Observation Stay (HOSPITAL_COMMUNITY)
Admission: RE | Admit: 2018-04-29 | Discharge: 2018-04-30 | Disposition: A | Discharge status: Home / Self Care | Payer: Medicare Other | Source: Ambulatory Visit | Attending: Vascular Surgery | Admitting: Vascular Surgery

## 2018-04-29 DIAGNOSIS — M797 Fibromyalgia: Secondary | ICD-10-CM | POA: Diagnosis not present

## 2018-04-29 DIAGNOSIS — Z7982 Long term (current) use of aspirin: Secondary | ICD-10-CM | POA: Insufficient documentation

## 2018-04-29 DIAGNOSIS — Z823 Family history of stroke: Secondary | ICD-10-CM | POA: Insufficient documentation

## 2018-04-29 DIAGNOSIS — Z91040 Latex allergy status: Secondary | ICD-10-CM | POA: Insufficient documentation

## 2018-04-29 DIAGNOSIS — F419 Anxiety disorder, unspecified: Secondary | ICD-10-CM | POA: Diagnosis not present

## 2018-04-29 DIAGNOSIS — J449 Chronic obstructive pulmonary disease, unspecified: Secondary | ICD-10-CM | POA: Diagnosis not present

## 2018-04-29 DIAGNOSIS — Z888 Allergy status to other drugs, medicaments and biological substances status: Secondary | ICD-10-CM | POA: Diagnosis not present

## 2018-04-29 DIAGNOSIS — I10 Essential (primary) hypertension: Secondary | ICD-10-CM | POA: Insufficient documentation

## 2018-04-29 DIAGNOSIS — E119 Type 2 diabetes mellitus without complications: Secondary | ICD-10-CM | POA: Insufficient documentation

## 2018-04-29 DIAGNOSIS — Z833 Family history of diabetes mellitus: Secondary | ICD-10-CM | POA: Insufficient documentation

## 2018-04-29 DIAGNOSIS — M5136 Other intervertebral disc degeneration, lumbar region: Secondary | ICD-10-CM | POA: Insufficient documentation

## 2018-04-29 DIAGNOSIS — Z880 Allergy status to penicillin: Secondary | ICD-10-CM | POA: Diagnosis not present

## 2018-04-29 DIAGNOSIS — M1990 Unspecified osteoarthritis, unspecified site: Secondary | ICD-10-CM | POA: Diagnosis not present

## 2018-04-29 DIAGNOSIS — M25551 Pain in right hip: Secondary | ICD-10-CM | POA: Diagnosis not present

## 2018-04-29 DIAGNOSIS — F17210 Nicotine dependence, cigarettes, uncomplicated: Secondary | ICD-10-CM | POA: Insufficient documentation

## 2018-04-29 DIAGNOSIS — E781 Pure hyperglyceridemia: Secondary | ICD-10-CM | POA: Insufficient documentation

## 2018-04-29 DIAGNOSIS — Z951 Presence of aortocoronary bypass graft: Secondary | ICD-10-CM | POA: Diagnosis not present

## 2018-04-29 DIAGNOSIS — Z9889 Other specified postprocedural states: Secondary | ICD-10-CM | POA: Insufficient documentation

## 2018-04-29 DIAGNOSIS — Z79899 Other long term (current) drug therapy: Secondary | ICD-10-CM | POA: Diagnosis not present

## 2018-04-29 DIAGNOSIS — Z95820 Peripheral vascular angioplasty status with implants and grafts: Secondary | ICD-10-CM | POA: Diagnosis not present

## 2018-04-29 DIAGNOSIS — Z90710 Acquired absence of both cervix and uterus: Secondary | ICD-10-CM | POA: Insufficient documentation

## 2018-04-29 DIAGNOSIS — Z881 Allergy status to other antibiotic agents status: Secondary | ICD-10-CM | POA: Insufficient documentation

## 2018-04-29 DIAGNOSIS — I701 Atherosclerosis of renal artery: Secondary | ICD-10-CM | POA: Diagnosis not present

## 2018-04-29 DIAGNOSIS — Z882 Allergy status to sulfonamides status: Secondary | ICD-10-CM | POA: Insufficient documentation

## 2018-04-29 DIAGNOSIS — Z885 Allergy status to narcotic agent status: Secondary | ICD-10-CM | POA: Diagnosis not present

## 2018-04-29 LAB — URINALYSIS, ROUTINE W REFLEX MICROSCOPIC
BILIRUBIN URINE: NEGATIVE
Glucose, UA: NEGATIVE mg/dL
Hgb urine dipstick: NEGATIVE
KETONES UR: NEGATIVE mg/dL
Leukocytes, UA: NEGATIVE
NITRITE: NEGATIVE
Protein, ur: NEGATIVE mg/dL
SPECIFIC GRAVITY, URINE: 1.004 — AB (ref 1.005–1.030)
pH: 6 (ref 5.0–8.0)

## 2018-04-29 LAB — COMPREHENSIVE METABOLIC PANEL
ALT: 13 U/L (ref 0–44)
ANION GAP: 7 (ref 5–15)
AST: 18 U/L (ref 15–41)
Albumin: 3.5 g/dL (ref 3.5–5.0)
Alkaline Phosphatase: 112 U/L (ref 38–126)
BILIRUBIN TOTAL: 0.7 mg/dL (ref 0.3–1.2)
BUN: 13 mg/dL (ref 8–23)
CHLORIDE: 103 mmol/L (ref 98–111)
CO2: 25 mmol/L (ref 22–32)
Calcium: 9.3 mg/dL (ref 8.9–10.3)
Creatinine, Ser: 1.33 mg/dL — ABNORMAL HIGH (ref 0.44–1.00)
GFR calc Af Amer: 45 mL/min — ABNORMAL LOW (ref >60)
GFR calc non Af Amer: 39 mL/min — ABNORMAL LOW (ref >60)
GLUCOSE: 195 mg/dL — AB (ref 70–99)
POTASSIUM: 4.5 mmol/L (ref 3.5–5.1)
Sodium: 135 mmol/L (ref 135–145)
TOTAL PROTEIN: 6.8 g/dL (ref 6.5–8.1)

## 2018-04-29 LAB — PROTIME-INR
INR: 0.97
Prothrombin Time: 12.8 seconds (ref 11.4–15.2)

## 2018-04-29 LAB — CBC
HEMATOCRIT: 38.5 % (ref 36.0–46.0)
Hemoglobin: 13.2 g/dL (ref 12.0–15.0)
MCH: 30.2 pg (ref 26.0–34.0)
MCHC: 34.3 g/dL (ref 30.0–36.0)
MCV: 88.1 fL (ref 78.0–100.0)
Platelets: 371 10*3/uL (ref 150–400)
RBC: 4.37 MIL/uL (ref 3.87–5.11)
RDW: 12.6 % (ref 11.5–15.5)
WBC: 9.9 10*3/uL (ref 4.0–10.5)

## 2018-04-29 LAB — GLUCOSE, CAPILLARY
Glucose-Capillary: 195 mg/dL — ABNORMAL HIGH (ref 70–99)
Glucose-Capillary: 182 mg/dL — ABNORMAL HIGH (ref 70–99)

## 2018-04-29 MED ORDER — GUAIFENESIN-DM 100-10 MG/5ML PO SYRP: 15.0000 mL | ORAL_SOLUTION | ORAL | Status: DC | PRN

## 2018-04-29 MED ORDER — METOPROLOL TARTRATE 5 MG/5ML IV SOLN: 2.0000 mg | INTRAVENOUS | Status: DC | PRN

## 2018-04-29 MED ORDER — SODIUM CHLORIDE 0.9 % IV SOLN: INTRAVENOUS | Status: DC

## 2018-04-29 MED ORDER — PANTOPRAZOLE SODIUM 40 MG PO TBEC: 40.0000 mg | DELAYED_RELEASE_TABLET | Freq: Every day | ORAL | Status: DC

## 2018-04-29 MED ORDER — LABETALOL HCL 5 MG/ML IV SOLN: 10.0000 mg | INTRAVENOUS | Status: DC | PRN

## 2018-04-29 MED ORDER — PHENOL 1.4 % MT LIQD: 1.0000 | OROMUCOSAL | Status: DC | PRN

## 2018-04-29 MED ORDER — HYDRALAZINE HCL 20 MG/ML IJ SOLN
5.0000 mg | INTRAMUSCULAR | Status: AC | PRN
  Administered 2018-04-29 – 2018-04-30 (×2): 5 mg via INTRAVENOUS
  Filled 2018-04-29 (×2): qty 1

## 2018-04-29 MED ORDER — ENOXAPARIN SODIUM 40 MG/0.4ML ~~LOC~~ SOLN
40.0000 mg | SUBCUTANEOUS | Status: DC
  Administered 2018-04-29: 40 mg via SUBCUTANEOUS
  Filled 2018-04-29: qty 0.4

## 2018-04-29 MED ORDER — POTASSIUM CHLORIDE CRYS ER 20 MEQ PO TBCR: 20.0000 meq | EXTENDED_RELEASE_TABLET | Freq: Once | ORAL | Status: DC | PRN

## 2018-04-29 MED ORDER — EZETIMIBE 10 MG PO TABS: 10.0000 mg | ORAL_TABLET | Freq: Every day | ORAL | Status: DC

## 2018-04-29 MED ORDER — ASPIRIN EC 325 MG PO TBEC: 325.0000 mg | DELAYED_RELEASE_TABLET | Freq: Every day | ORAL | Status: DC

## 2018-04-29 MED ORDER — INSULIN ASPART 100 UNIT/ML ~~LOC~~ SOLN
0.0000 [IU] | Freq: Three times a day (TID) | SUBCUTANEOUS | Status: DC
  Administered 2018-04-29: 3 [IU] via SUBCUTANEOUS
  Administered 2018-04-30: 2 [IU] via SUBCUTANEOUS

## 2018-04-29 MED ORDER — ALPRAZOLAM 0.5 MG PO TABS
0.5000 mg | ORAL_TABLET | Freq: Three times a day (TID) | ORAL | Status: DC | PRN
  Administered 2018-04-29 – 2018-04-30 (×2): 0.5 mg via ORAL
  Filled 2018-04-29 (×2): qty 1

## 2018-04-29 MED ORDER — KCL IN DEXTROSE-NACL 20-5-0.45 MEQ/L-%-% IV SOLN
INTRAVENOUS | Status: DC
  Administered 2018-04-29 – 2018-04-30 (×2): via INTRAVENOUS
  Filled 2018-04-29 (×2): qty 1000

## 2018-04-29 MED ORDER — ALUM & MAG HYDROXIDE-SIMETH 200-200-20 MG/5ML PO SUSP: 15.0000 mL | ORAL | Status: DC | PRN

## 2018-04-29 MED ORDER — IRBESARTAN 300 MG PO TABS: 300.0000 mg | ORAL_TABLET | Freq: Every day | ORAL | Status: DC

## 2018-04-29 MED ORDER — NICOTINE 14 MG/24HR TD PT24
14.0000 mg | MEDICATED_PATCH | Freq: Every day | TRANSDERMAL | Status: DC
  Administered 2018-04-29: 14 mg via TRANSDERMAL
  Filled 2018-04-29: qty 1

## 2018-04-29 MED ORDER — HYDRALAZINE HCL 20 MG/ML IJ SOLN
5.0000 mg | INTRAMUSCULAR | Status: AC | PRN
  Administered 2018-04-29 (×2): 5 mg via INTRAVENOUS
  Filled 2018-04-29: qty 1

## 2018-04-29 MED ORDER — ONDANSETRON HCL 4 MG/2ML IJ SOLN: 4.0000 mg | Freq: Four times a day (QID) | INTRAMUSCULAR | Status: DC | PRN

## 2018-04-30 ENCOUNTER — Encounter (HOSPITAL_COMMUNITY): Payer: Self-pay

## 2018-04-30 ENCOUNTER — Encounter (HOSPITAL_COMMUNITY)
Admission: RE | Disposition: A | Discharge status: Home / Self Care | Payer: Self-pay | Source: Ambulatory Visit | Attending: Vascular Surgery

## 2018-04-30 ENCOUNTER — Telehealth: Payer: Self-pay | Admitting: Vascular Surgery

## 2018-04-30 DIAGNOSIS — M5136 Other intervertebral disc degeneration, lumbar region: Secondary | ICD-10-CM | POA: Diagnosis not present

## 2018-04-30 DIAGNOSIS — I10 Essential (primary) hypertension: Secondary | ICD-10-CM | POA: Diagnosis not present

## 2018-04-30 DIAGNOSIS — J449 Chronic obstructive pulmonary disease, unspecified: Secondary | ICD-10-CM | POA: Diagnosis not present

## 2018-04-30 DIAGNOSIS — E119 Type 2 diabetes mellitus without complications: Secondary | ICD-10-CM | POA: Diagnosis not present

## 2018-04-30 DIAGNOSIS — I701 Atherosclerosis of renal artery: Secondary | ICD-10-CM | POA: Diagnosis not present

## 2018-04-30 DIAGNOSIS — E781 Pure hyperglyceridemia: Secondary | ICD-10-CM | POA: Diagnosis not present

## 2018-04-30 HISTORY — PX: RENAL ANGIOGRAPHY: CATH118260

## 2018-04-30 LAB — GLUCOSE, CAPILLARY
GLUCOSE-CAPILLARY: 128 mg/dL — AB (ref 70–99)
GLUCOSE-CAPILLARY: 108 mg/dL — AB (ref 70–99)
Glucose-Capillary: 135 mg/dL — ABNORMAL HIGH (ref 70–99)

## 2018-04-30 LAB — MRSA PCR SCREENING: MRSA by PCR: NEGATIVE

## 2018-04-30 SURGERY — RENAL ANGIOGRAPHY
Anesthesia: LOCAL

## 2018-04-30 MED ORDER — LIDOCAINE HCL (PF) 1 % IJ SOLN
INTRAMUSCULAR | Status: AC
  Filled 2018-04-30: qty 30

## 2018-04-30 MED ORDER — MIDAZOLAM HCL 2 MG/2ML IJ SOLN
INTRAMUSCULAR | Status: AC
  Filled 2018-04-30: qty 2

## 2018-04-30 MED ORDER — HEPARIN (PORCINE) IN NACL 1000-0.9 UT/500ML-% IV SOLN
INTRAVENOUS | Status: AC
  Filled 2018-04-30: qty 1000

## 2018-04-30 MED ORDER — HEPARIN SODIUM (PORCINE) 1000 UNIT/ML IJ SOLN
INTRAMUSCULAR | Status: DC | PRN
  Administered 2018-04-30: 7000 [IU] via INTRAVENOUS

## 2018-04-30 MED ORDER — MIDAZOLAM HCL 2 MG/2ML IJ SOLN
INTRAMUSCULAR | Status: DC | PRN
  Administered 2018-04-30: 2 mg via INTRAVENOUS

## 2018-04-30 MED ORDER — CLOPIDOGREL BISULFATE 300 MG PO TABS
ORAL_TABLET | ORAL | Status: AC
  Filled 2018-04-30: qty 1

## 2018-04-30 MED ORDER — LIDOCAINE HCL (PF) 1 % IJ SOLN
INTRAMUSCULAR | Status: DC | PRN
  Administered 2018-04-30: 15 mL

## 2018-04-30 MED ORDER — HEPARIN SODIUM (PORCINE) 1000 UNIT/ML IJ SOLN
INTRAMUSCULAR | Status: AC
  Filled 2018-04-30: qty 1

## 2018-04-30 MED ORDER — FENTANYL CITRATE (PF) 100 MCG/2ML IJ SOLN
INTRAMUSCULAR | Status: DC | PRN
  Administered 2018-04-30: 25 ug via INTRAVENOUS

## 2018-04-30 MED ORDER — FENTANYL CITRATE (PF) 100 MCG/2ML IJ SOLN
INTRAMUSCULAR | Status: AC
  Filled 2018-04-30: qty 2

## 2018-04-30 MED ORDER — IOPAMIDOL (ISOVUE-370) INJECTION 76%
INTRAVENOUS | Status: DC | PRN
  Administered 2018-04-30: 35 mL via INTRA_ARTERIAL

## 2018-04-30 MED ORDER — CLOPIDOGREL BISULFATE 75 MG PO TABS: 75.0000 mg | ORAL_TABLET | Freq: Every day | ORAL | 11 refills | Status: DC

## 2018-04-30 MED ORDER — HEPARIN (PORCINE) IN NACL 1000-0.9 UT/500ML-% IV SOLN
INTRAVENOUS | Status: DC | PRN
  Administered 2018-04-30 (×2): 500 mL

## 2018-04-30 SURGICAL SUPPLY — 19 items
CATH ANGIO 5F BER2 65CM (CATHETERS) ×1 IMPLANT
CATH OMNI FLUSH 5F 65CM (CATHETERS) ×1 IMPLANT
CATH SOS OMNI O 5F 80CM (CATHETERS) ×1 IMPLANT
COVER DOME SNAP 22 D (MISCELLANEOUS) ×1 IMPLANT
DEVICE CLOSURE MYNXGRIP 6/7F (Vascular Products) ×1 IMPLANT
GUIDE CATH VISTA IMA 6F (CATHETERS) ×1 IMPLANT
KIT ENCORE 26 ADVANTAGE (KITS) ×1 IMPLANT
KIT MICROPUNCTURE NIT STIFF (SHEATH) ×1 IMPLANT
KIT PV (KITS) ×2 IMPLANT
SHEATH PINNACLE 5F 10CM (SHEATH) ×1 IMPLANT
SHEATH PINNACLE 6F 10CM (SHEATH) ×1 IMPLANT
SHEATH PROBE COVER 6X72 (BAG) ×1 IMPLANT
STENT HERCULINK RX 5.0X18X135 (Permanent Stent) ×1 IMPLANT
SYR MEDRAD MARK V 150ML (SYRINGE) ×2 IMPLANT
TRANSDUCER W/STOPCOCK (MISCELLANEOUS) ×2 IMPLANT
TRAY PV CATH (CUSTOM PROCEDURE TRAY) ×2 IMPLANT
WIRE BENTSON .035X145CM (WIRE) ×1 IMPLANT
WIRE ROSEN-J .035X260CM (WIRE) ×1 IMPLANT
WIRE SPARTACORE .014X190CM (WIRE) ×1 IMPLANT

## 2018-04-30 NOTE — Op Note (Signed)
    Patient name: Lindsey [name] MRN: [id number] DOB: 1946-04-20 Sex: female  04/30/2018 Pre-operative Diagnosis: Uncontrolled hypertension with renal artery stenosis Post-operative diagnosis:  Same Surgeon:  Eda Paschal. Donzetta Matters, MD Procedure Performed: 1.  Ultrasound-guided cannulation right common femoral artery 2.  Aortogram 3.  Stent of right renal artery with 5 x 18 balloon expandable 4.  Moderate sedation with fentanyl Versed for 44 minutes 5.  Minx device closure right common femoral artery access   Indications: 72 year old female has previously undergone aortobiiliac bypass.  She also has a stent in the left renal artery.  That is known to have stenosis in the left kidney itself is only 8 cm.  She has a right-sided kidney that is normal size with high-grade stenosis by duplex.  She is indicated for aortogram possible intervention.  Findings: The aorta iliac bypass is patent.  Just above that there is severe calcification and narrowing to about 30% of the normal lumen of the aorta just inferior to the renal arteries.  The right renal artery has a 70% stenosis and after stenting is reduced to 0%.  The left renal artery stent has at least 70% stenosis residual.   Procedure:  The patient was identified in the holding area and taken to room 8.  The patient was then placed supine on the table and prepped and draped in the usual sterile fashion.  A time out was called.  Ultrasound was used to evaluate the right common femoral artery this was noted to be patent and an image was saved to the permanent record.  This was cannulated with micropuncture needle followed by wire and sheath.  We then placed a 5 French sheath.  I used a Bentson wire and was able to traverse the aortic stenosis with bare catheter confirmed intraluminal access.  We exchanged for a Omni Flush catheter performed aortogram which demonstrated tight stenosis of bilateral renal arteries.  The left renal artery has a stent.  The right  renal artery is negative in the right kidney is normal by previous duplex.  With this we exchanged for a short 6 French sheath the patient was given 7000 units of heparin.  I used an IM guide catheter SOS catheter to select the right renal artery and confirmed intraluminal access.  I then placed a Rosen wire was able to get my IM guide catheter into the renal artery.  I exchanged for a [place]a core wire that was 014.  I then brought a balloon expandable 014 stent to the field but at the level of the stenosis.  I back the IM guide catheter up performed angiogram and then deployed my stent.  Completion angiogram demonstrated no residual stenosis.  The wire and I am guide catheter were removed.  I then deployed a minx device in the right groin which took well.  She tolerated this procedure without any comp occasion.  Next  Contrast 35 cc.    Dalvin Clipper C. Donzetta Matters, MD [facility] Office: [phone] Pager: [phone]

## 2018-04-30 NOTE — H&P (Signed)
HP    MRN #:  [id number]  History of Present Illness:   Lindsey [name] is a 72 y.o. female whom Dr. 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 Glade Nurse. Patient had aortobiiliac graft done at that time. She has known abnormal renal duplex scans of both renal arteries. She also has carotid occlusive disease which has been stable with moderate stenosis bilaterally and is asymptomatic. She has a heavy tobacco abuse history currently smoking 1-2 ppd and has smoked since age 52.   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 She denies non healing wounds in her feet or legs.. Pt states pain in her right low back and hip has elevated her blood pressure. She has discussed this with her PCP. Pt states she has had low back and right hip pain for years, was taking long term Percocet or Darvocet for pain.  She denies any hx of stroke or TIA.  She states her hypertension medications are being changed.  She is seeing a mental health specialist, "I'm trying to come off nerve tablets".  She has a lump on the left side of her face, is not painful, pt states her PCP knows about this.   Pt denies headache, denies dyspnea, denies chest pain.  She states she forgot to take her blood pressure medication today.   Pt Diabetic: Yes,most recentA1C was5.8 [place]; 01-19-17 labs received from same practice: serum creatinine: 1.12, total cholesterol: 306, triglycerides: 405, HDL: 34, unable to calculate LDL due to very elevated triglycerides. Pt smoker: smoker (1-2 ppd, started at age 47 yrs)  Pt meds include: Statin :No Betablocker: No ASA: Yes Other anticoagulants/antiplatelets: no    Past Medical History:  Diagnosis Date  . Anxiety   . Arthritis   . Asthma   .  COPD (chronic obstructive pulmonary disease) ([place])   . Fibromyalgia   . Hyperlipidemia   . Hypertension   . Prediabetes     Past Surgical History:  Procedure Laterality Date  . ABDOMINAL HYSTERECTOMY    . APPENDECTOMY  2009  . INCISIONAL HERNIA REPAIR    . PR VEIN BYPASS GRAFT,AORTO-FEM-POP  1992   by Dr. Scot Dock  . RENAL ARTERY STENT  2009   Left renal artery   done by Dr. Amedeo Plenty  . VASCULAR SURGERY      Allergies  Allergen Reactions  . Acyclovir And Related Other (See Comments)    Made fever blister worse  . Baclofen Other (See Comments)    Made muscle spasms worse  . Biaxin [Clarithromycin] Other (See Comments)    Unknown reaction to XL - can take immediate release up to 500 mg   . Buspar [Buspirone] Other (See Comments)    nightmares  . Claritin [Loratadine] Itching  . Codeine Other (See Comments)    Makes pt aggressive   . Colestid [Colestipol Hcl] Other (See Comments)    Flushing  . Combivent [Ipratropium-Albuterol] Other (See Comments)    Causes throat irritation   . Crestor [Rosuvastatin Calcium] Other (See Comments)    Myalgias OR ANY STATIN  . Cymbalta [Duloxetine Hcl] Swelling    Hand swelling  . Decongestant-Antihistamine [Triprolidine-Pseudoephedrine] Other (See Comments)    Causes BP to rise  . Depo-Medrol [Methylprednisolone] Itching  . Efudex [Fluorouracil] Other (See Comments)  Pain and bleeding  . Fenofibrate Other (See Comments)    Interfered with meds   . Flagyl [Metronidazole] Other (See Comments)    Causes calcium build up in ears - dizziness  . Flexeril [Cyclobenzaprine] Other (See Comments)    Salvia increase, facial swelling  . Flonase [Fluticasone Propionate] Other (See Comments)    Nose bleeds  . Gabapentin Other (See Comments)    Swelling in hands  . Hctz [Hydrochlorothiazide] Other (See Comments)    Fainting - BP drops  . Hydrocodone Nausea And Vomiting    bp DROPS  . Hydroxyzine Other (See Comments)    Makes pt feel drugged,  unbalanced  . Isosorbide Other (See Comments)    Unknown reaction  . Levsin [Hyoscyamine Sulfate] Swelling  . Losartan Potassium Other (See Comments)    Caused potassium  build up in blood  . Lotrel [Amlodipine Besy-Benazepril Hcl] Other (See Comments)    Unknown reaction  . Macrobid [Nitrofurantoin] Other (See Comments)    Hospitalized for severe itching  . Meclizine     Effects nervous system  . Nasonex [Mometasone Furoate] Other (See Comments)    Nose bleeds  . Niaspan [Niacin] Other (See Comments)    Flushing, sweating  . Norvasc [Amlodipine Besylate] Other (See Comments)    Headache, constipation,  Flushing, nausea, tiredness. mood changes  . Paxil [Paroxetine Hcl] Other (See Comments)    Pt was in "outer space"  . Phenergan [Promethazine Hcl] Other (See Comments)    Caused mouth dryness   . Premarin [Conjugated Estrogens] Other (See Comments)    CAUSED BLEEDING (cream)  . Red Dye Other (See Comments)    Headache, irritable, weakness  . Relafen [Nabumetone] Other (See Comments)    Severe headaches  . Singulair [Montelukast Sodium] Other (See Comments)    Unknown reaction  . Spiriva [Tiotropium Bromide Monohydrate] Other (See Comments)    Irritates throat, dry mouth  . Statins     Muscle aches  . Sulfa Antibiotics Swelling    Edema, N&V  . Tequin [Gatifloxacin] Other (See Comments)    Unknown reaction (may have burned eyes)  . Tramadol Other (See Comments)    Pt states she can not handle med, nausea, constipation, anxiety, dry mouth, mental confusion, tingling in hands and feet  . Transderm-Scop [Scopolamine]     Lock jaw  . Trazodone And Nefazodone Other (See Comments)    Involuntary muscle movements   . Triamcinolone Other (See Comments)    Dried skin, cracked skin  . Venlafaxine Other (See Comments)    Possibly caused confusion  . Advair Diskus [Fluticasone-Salmeterol] Palpitations  . Amoxicillin Palpitations    Has patient had a PCN reaction causing immediate  rash, facial/tongue/throat swelling, SOB or lightheadedness with hypotension: yes Has patient had a PCN reaction causing severe rash involving mucus membranes or skin necrosis: no Has patient had a PCN reaction that required hospitalization: no Has patient had a PCN reaction occurring within the last 10 years: yes If all of the above answers are "NO", then may proceed with Cephalosporin use.   . Augmentin [Amoxicillin-Pot Clavulanate] Palpitations  . Ephedrine Palpitations    BP rises  . Latex Rash  . Prednisone Palpitations    Pressure in eyes    Prior to Admission medications   Medication Sig Start Date End Date Taking? Authorizing Provider  ALPRAZolam (XANAX) 0.25 MG tablet Take 0.25 mg by mouth See admin instructions. Take one tablet (0.25 mg) by mouth twice daily - 1330 and 230  Yes [provider]  aspirin 325 MG EC tablet Take 325 mg by mouth daily at 2 PM.    Yes [provider]  ezetimibe (ZETIA) 10 MG tablet Take 10 mg by mouth See admin instructions. Take one tablet (10 mg) by mouth daily at 1:30am   Yes [provider]  Multiple Vitamins-Iron (MULTIPLE VITAMIN/IRON PO) Take 1 tablet by mouth daily at 2 PM.    Yes [provider]  omeprazole (PRILOSEC) 20 MG capsule Take 20 mg by mouth See admin instructions. Take one capsule (20 mg) by mouth daily at 10:30pm   Yes [provider]  valsartan (DIOVAN) 320 MG tablet Take 320 mg by mouth daily at 2 PM.    Yes [provider]    Social History   Socioeconomic History  . Marital status: Divorced    Spouse name: Not on file  . Number of children: Not on file  . Years of education: Not on file  . Highest education level: Not on file  Occupational History  . Not on file  Social Needs  . Financial resource strain: Not on file  . Food insecurity:    Worry: Not on file    Inability: Not on file  . Transportation needs:    Medical: Not on file    Non-medical: Not on file    Tobacco Use  . Smoking status: Current Every Day Smoker    Packs/day: 1.50    Types: Cigarettes  . Smokeless tobacco: Never Used  Substance and Sexual Activity  . Alcohol use: No    Alcohol/week: 0.0 oz  . Drug use: No  . Sexual activity: Not on file  Lifestyle  . Physical activity:    Days per week: Not on file    Minutes per session: Not on file  . Stress: Not on file  Relationships  . Social connections:    Talks on phone: Not on file    Gets together: Not on file    Attends religious service: Not on file    Active member of club or organization: Not on file    Attends meetings of clubs or organizations: Not on file    Relationship status: Not on file  . Intimate partner violence:    Fear of current or ex partner: Not on file    Emotionally abused: Not on file    Physically abused: Not on file    Forced sexual activity: Not on file  Other Topics Concern  . Not on file  Social History Narrative  . Not on file     Family History  Problem Relation Age of Onset  . Diabetes Other   . Stroke Other     ROS: [x]  Positive   [ ]  Negative   [ ]  All sytems reviewed and are negative  Cardiovascular: []  chest pain/pressure []  palpitations []  SOB lying flat []  DOE []  pain in legs while walking []  pain in legs at rest []  pain in legs at night []  non-healing ulcers []  hx of DVT []  swelling in legs  Pulmonary: []  productive cough []  asthma/wheezing []  home O2  Neurologic: []  weakness in []  arms []  legs []  numbness in []  arms []  legs []  hx of CVA []  mini stroke [] difficulty speaking or slurred speech []  temporary loss of vision in one eye []  dizziness  Hematologic: []  hx of cancer []  bleeding problems []  problems with blood clotting easily  Endocrine:   []  diabetes []  thyroid disease  GI []  vomiting blood []  blood in stool  GU: []  CKD/renal failure []  HD--[]  M/W/F or []  T/T/S []  burning with urination []  blood in urine  Psychiatric: []   anxiety []  depression  Musculoskeletal: []  arthritis []  joint pain  Integumentary: []  rashes []  ulcers  Constitutional: []  fever []  chills   Physical Examination  Vitals:   04/30/18 0400 04/30/18 0415  BP: (!) 173/54 (!) 159/47  Pulse:    Resp: 16 18  Temp:  97.9 F (36.6 C)  SpO2:  100%   Body mass index is 26.7 kg/m.  General:  Well nourished femalein NAD, strong odor of cigarette smoke on her person and her partner with her Gait: antalgic HENT:  Smooth raised mass left side of face, states her PCP is aware of this. Eyes: PERRLA Pulmonary: slightly labored breathing at rest,diminished air movement in all fields,+rales, rhonchi, and wheezes in left posterior fields Cardiac: Regular rhythm and rate, + murmur  Abdomen: soft, NT, no masses palpated Skin: no rashes, no ulcers, no cellulitis.      CBC    Component Value Date/Time   WBC 9.9 04/29/2018 1647   RBC 4.37 04/29/2018 1647   HGB 13.2 04/29/2018 1647   HCT 38.5 04/29/2018 1647   PLT 371 04/29/2018 1647   MCV 88.1 04/29/2018 1647   MCH 30.2 04/29/2018 1647   MCHC 34.3 04/29/2018 1647   RDW 12.6 04/29/2018 1647    BMET    Component Value Date/Time   NA 135 04/29/2018 1647   K 4.5 04/29/2018 1647   CL 103 04/29/2018 1647   CO2 25 04/29/2018 1647   GLUCOSE 195 (H) 04/29/2018 1647   BUN 13 04/29/2018 1647   CREATININE 1.33 (H) 04/29/2018 1647   CREATININE 1.08 09/13/2013 1440   CALCIUM 9.3 04/29/2018 1647   GFRNONAA 39 (L) 04/29/2018 1647   GFRAA 45 (L) 04/29/2018 1647    COAGS: Lab Results  Component Value Date   INR 0.97 04/29/2018   INR 0.9 03/05/2008     Non-Invasive Vascular Imaging:    Right: Normal size right kidney. Evidence of a greater than 60%    stenosis of the right renal artery. Cyst(s) noted. RRV flow    present. Left: Abnormal size left renal artery. Evidence of a > 60% stenosis    in the left renal artery. LRV flow present. *See table(s) above  for measurements and observations.  ASSESSMENT/PLAN: This is a 72 y.o. female with renal artery stenosis with normal-sized kidney on the right side and elevated creatinine as well as hypertension.  She is indicated for angiogram possible stenting of her right renal artery.  Shelaine Frie C. Donzetta Matters, MD [facility] [place] Office: [phone] Pager: [phone]

## 2018-04-30 NOTE — Progress Notes (Signed)
Inpatient Diabetes Program Recommendations  AACE/ADA: New Consensus Statement on Inpatient Glycemic Control (2019)  Target Ranges:  Prepandial:   less than 140 mg/dL      Peak postprandial:   less than 180 mg/dL (1-2 hours)      Critically ill patients:  140 - 180 mg/dL   Results for Lindsey, [name] (MRN [id number]) as of 04/30/2018 08:08  Ref. Range 04/29/2018 16:38 04/29/2018 20:43 04/30/2018 06:07  Glucose-Capillary Latest Ref Range: 70 - 99 mg/dL 195 (H) 182 (H) 128 (H)    Review of Glycemic Control  Diabetes history: DM2 Outpatient Diabetes medications: None Current orders for Inpatient glycemic control: Novolog 0-15 units TID with meals  Inpatient Diabetes Program Recommendations: Correction (SSI): Please consider ordering Novolog 0-5 units QHS.  Thanks, Barnie Alderman, RN, MSN, CDE Diabetes Coordinator Inpatient Diabetes Program [phone] (Team Pager from 8am to 5pm)

## 2018-04-30 NOTE — Telephone Encounter (Signed)
sch appt spk to pt 06/22/18 3pm renal art 4pm p/o MD

## 2018-04-30 NOTE — Progress Notes (Signed)
Pt blood pressure elevated several times through night. Treated with prn. bp responds to prn however pt does not like the way it makes her feel. Will continue to follow.

## 2018-05-01 MED FILL — Clopidogrel Bisulfate Tab 300 MG (Base Equiv): ORAL | Qty: 1 | Status: AC

## 2018-05-01 NOTE — Discharge Summary (Signed)
Patient ID: Lindsey [name] MRN: [id number] DOB/AGE: 01/11/1946 72 y.o.  Admit date: 04/29/2018 Discharge date: April 30, 2018  Admission Diagnosis: Symptomatic renal artery stenosis  Discharge Diagnoses:  Same  Secondary Diagnoses: Active Problems:   Renal artery stenosis [facility])   Procedures: Stent of right renal artery  Discharged Condition: good  Hospital Course: Patient was admitted on Sunday prior to a Monday procedure for overnight hydration.  On Monday, 29 July she underwent right renal artery stenting via right common femoral artery approach.  A closure device was placed.  She tolerated this procedure well was sitting up in bed tolerating food without hematoma in the afternoon following the procedure and was ready for discharge.  Consults:  None  Significant Diagnostic Studies: CBC CBC Latest Ref Rng & Units 04/29/2018 06/05/2008 06/04/2008  WBC 4.0 - 10.5 K/uL 9.9 8.4 12.4(H)  Hemoglobin 12.0 - 15.0 g/dL 13.2 12.9 13.3  Hematocrit 36.0 - 46.0 % 38.5 38.0 38.4  Platelets 150 - 400 K/uL 371 274 315        COAG Lab Results  Component Value Date   INR 0.97 04/29/2018   INR 0.9 03/05/2008   No results found for: PTT  Disposition: Home  Discharge Instructions    Call MD for:  redness, tenderness, or signs of infection (pain, swelling, bleeding, redness, odor or green/yellow discharge around incision site)   Complete by:  As directed    Call MD for:  severe or increased pain, loss or decreased feeling  in affected limb(s)   Complete by:  As directed    Call MD for:  temperature >100.5   Complete by:  As directed    Resume previous diet   Complete by:  As directed      Allergies as of 04/30/2018      Reactions   Acyclovir And Related Other (See Comments)   Made fever blister worse   Baclofen Other (See Comments)   Made muscle spasms worse   Biaxin [clarithromycin] Other (See Comments)   Unknown reaction to XL - can take immediate release up to 500  mg   Buspar [buspirone] Other (See Comments)   nightmares   Claritin [loratadine] Itching   Codeine Other (See Comments)   Makes pt aggressive    Colestid [colestipol Hcl] Other (See Comments)   Flushing   Combivent [ipratropium-albuterol] Other (See Comments)   Causes throat irritation    Crestor [rosuvastatin Calcium] Other (See Comments)   Myalgias OR ANY STATIN   Cymbalta [duloxetine Hcl] Swelling   Hand swelling   Decongestant-antihistamine [triprolidine-pseudoephedrine] Other (See Comments)   Causes BP to rise   Depo-medrol [methylprednisolone] Itching   Efudex [fluorouracil] Other (See Comments)   Pain and bleeding   Fenofibrate Other (See Comments)   Interfered with meds    Flagyl [metronidazole] Other (See Comments)   Causes calcium build up in ears - dizziness   Flexeril [cyclobenzaprine] Other (See Comments)   Salvia increase, facial swelling   Flonase [fluticasone Propionate] Other (See Comments)   Nose bleeds   Gabapentin Other (See Comments)   Swelling in hands   Hctz [hydrochlorothiazide] Other (See Comments)   Fainting - BP drops   Hydrocodone Nausea And Vomiting   bp DROPS   Hydroxyzine Other (See Comments)   Makes pt feel drugged, unbalanced   Isosorbide Other (See Comments)   Unknown reaction   Levsin [hyoscyamine Sulfate] Swelling   Losartan Potassium Other (See Comments)   Caused potassium  build up  in blood   Lotrel [amlodipine Besy-benazepril Hcl] Other (See Comments)   Unknown reaction   Macrobid [nitrofurantoin] Other (See Comments)   Hospitalized for severe itching   Meclizine    Effects nervous system   Nasonex [mometasone Furoate] Other (See Comments)   Nose bleeds   Niaspan [niacin] Other (See Comments)   Flushing, sweating   Norvasc [amlodipine Besylate] Other (See Comments)   Headache, constipation,  Flushing, nausea, tiredness. mood changes   Paxil [paroxetine Hcl] Other (See Comments)   Pt was in "outer space"   Phenergan  [promethazine Hcl] Other (See Comments)   Caused mouth dryness    Premarin [conjugated Estrogens] Other (See Comments)   CAUSED BLEEDING (cream)   Red Dye Other (See Comments)   Headache, irritable, weakness   Relafen [nabumetone] Other (See Comments)   Severe headaches   Singulair [montelukast Sodium] Other (See Comments)   Unknown reaction   Spiriva [tiotropium Bromide Monohydrate] Other (See Comments)   Irritates throat, dry mouth   Statins    Muscle aches   Sulfa Antibiotics Swelling   Edema, N&V   Tequin [gatifloxacin] Other (See Comments)   Unknown reaction (may have burned eyes)   Tramadol Other (See Comments)   Pt states she can not handle med, nausea, constipation, anxiety, dry mouth, mental confusion, tingling in hands and feet   Transderm-scop [scopolamine]    Lock jaw   Trazodone And Nefazodone Other (See Comments)   Involuntary muscle movements    Triamcinolone Other (See Comments)   Dried skin, cracked skin   Venlafaxine Other (See Comments)   Possibly caused confusion   Advair Diskus [fluticasone-salmeterol] Palpitations   Amoxicillin Palpitations   Has patient had a PCN reaction causing immediate rash, facial/tongue/throat swelling, SOB or lightheadedness with hypotension: yes Has patient had a PCN reaction causing severe rash involving mucus membranes or skin necrosis: no Has patient had a PCN reaction that required hospitalization: no Has patient had a PCN reaction occurring within the last 10 years: yes If all of the above answers are "NO", then may proceed with Cephalosporin use.   Augmentin [amoxicillin-pot Clavulanate] Palpitations   Ephedrine Palpitations   BP rises   Latex Rash   Prednisone Palpitations   Pressure in eyes      Medication List    TAKE these medications   ALPRAZolam 0.25 MG tablet Commonly known as:  XANAX Take 0.25 mg by mouth See admin instructions. Take one tablet (0.25 mg) by mouth twice daily - 1330 and 230   aspirin 325 MG  EC tablet Take 325 mg by mouth daily at 2 PM.   clopidogrel 75 MG tablet Commonly known as:  PLAVIX Take 1 tablet (75 mg total) by mouth daily.   ezetimibe 10 MG tablet Commonly known as:  ZETIA Take 10 mg by mouth See admin instructions. Take one tablet (10 mg) by mouth daily at 1:30am   MULTIPLE VITAMIN/IRON PO Take 1 tablet by mouth daily at 2 PM.   omeprazole 20 MG capsule Commonly known as:  PRILOSEC Take 20 mg by mouth See admin instructions. Take one capsule (20 mg) by mouth daily at 10:30pm   valsartan 320 MG tablet Commonly known as:  DIOVAN Take 320 mg by mouth daily at 2 PM.        Signed:  Wenona [place]ville C. Donzetta Matters, MD [facility] [place] Office: [phone] Pager: [phone]   05/01/2018, 8:38 AM

## 2018-05-02 ENCOUNTER — Other Ambulatory Visit: Payer: Self-pay

## 2018-05-02 DIAGNOSIS — I1310 Hypertensive heart and chronic kidney disease without heart failure, with stage 1 through stage 4 chronic kidney disease, or unspecified chronic kidney disease: Secondary | ICD-10-CM | POA: Diagnosis not present

## 2018-05-02 DIAGNOSIS — I701 Atherosclerosis of renal artery: Secondary | ICD-10-CM

## 2018-05-02 DIAGNOSIS — Z6827 Body mass index (BMI) 27.0-27.9, adult: Secondary | ICD-10-CM | POA: Diagnosis not present

## 2018-05-02 DIAGNOSIS — Z959 Presence of cardiac and vascular implant and graft, unspecified: Secondary | ICD-10-CM

## 2018-05-02 DIAGNOSIS — I779 Disorder of arteries and arterioles, unspecified: Secondary | ICD-10-CM

## 2018-06-15 ENCOUNTER — Encounter (HOSPITAL_COMMUNITY): Payer: Medicare Other

## 2018-06-15 ENCOUNTER — Encounter: Payer: Medicare Other | Admitting: Vascular Surgery

## 2018-06-22 ENCOUNTER — Encounter (HOSPITAL_COMMUNITY): Payer: Medicare Other

## 2018-06-22 ENCOUNTER — Encounter: Payer: Medicare Other | Admitting: Vascular Surgery

## 2018-06-27 ENCOUNTER — Encounter (HOSPITAL_COMMUNITY): Payer: Medicare Other

## 2018-06-29 ENCOUNTER — Encounter (HOSPITAL_COMMUNITY): Payer: Medicare Other

## 2018-06-29 ENCOUNTER — Encounter: Payer: Medicare Other | Admitting: Vascular Surgery

## 2018-07-06 ENCOUNTER — Encounter: Payer: Medicare Other | Admitting: Vascular Surgery

## 2018-07-26 DIAGNOSIS — R7303 Prediabetes: Secondary | ICD-10-CM | POA: Diagnosis not present

## 2018-07-26 DIAGNOSIS — I10 Essential (primary) hypertension: Secondary | ICD-10-CM | POA: Diagnosis not present

## 2018-07-26 DIAGNOSIS — E782 Mixed hyperlipidemia: Secondary | ICD-10-CM | POA: Diagnosis not present

## 2018-08-01 DIAGNOSIS — I6522 Occlusion and stenosis of left carotid artery: Secondary | ICD-10-CM | POA: Diagnosis not present

## 2018-08-01 DIAGNOSIS — I701 Atherosclerosis of renal artery: Secondary | ICD-10-CM | POA: Diagnosis not present

## 2018-08-01 DIAGNOSIS — F17200 Nicotine dependence, unspecified, uncomplicated: Secondary | ICD-10-CM | POA: Diagnosis not present

## 2018-08-01 DIAGNOSIS — E1159 Type 2 diabetes mellitus with other circulatory complications: Secondary | ICD-10-CM | POA: Diagnosis not present

## 2018-08-01 DIAGNOSIS — I1310 Hypertensive heart and chronic kidney disease without heart failure, with stage 1 through stage 4 chronic kidney disease, or unspecified chronic kidney disease: Secondary | ICD-10-CM | POA: Diagnosis not present

## 2018-08-01 DIAGNOSIS — I10 Essential (primary) hypertension: Secondary | ICD-10-CM | POA: Diagnosis not present

## 2018-08-01 DIAGNOSIS — R011 Cardiac murmur, unspecified: Secondary | ICD-10-CM | POA: Diagnosis not present

## 2018-08-06 DIAGNOSIS — F419 Anxiety disorder, unspecified: Secondary | ICD-10-CM | POA: Diagnosis not present

## 2018-08-09 DIAGNOSIS — R011 Cardiac murmur, unspecified: Secondary | ICD-10-CM | POA: Diagnosis not present

## 2018-08-15 DIAGNOSIS — E1369 Other specified diabetes mellitus with other specified complication: Secondary | ICD-10-CM | POA: Diagnosis not present

## 2018-08-15 DIAGNOSIS — J181 Lobar pneumonia, unspecified organism: Secondary | ICD-10-CM | POA: Diagnosis not present

## 2018-08-15 DIAGNOSIS — E1159 Type 2 diabetes mellitus with other circulatory complications: Secondary | ICD-10-CM | POA: Diagnosis not present

## 2018-08-15 DIAGNOSIS — I10 Essential (primary) hypertension: Secondary | ICD-10-CM | POA: Diagnosis not present

## 2018-08-22 DIAGNOSIS — J42 Unspecified chronic bronchitis: Secondary | ICD-10-CM | POA: Diagnosis not present

## 2018-08-22 DIAGNOSIS — I10 Essential (primary) hypertension: Secondary | ICD-10-CM | POA: Diagnosis not present

## 2018-08-22 DIAGNOSIS — E781 Pure hyperglyceridemia: Secondary | ICD-10-CM | POA: Diagnosis not present

## 2018-08-22 DIAGNOSIS — E1159 Type 2 diabetes mellitus with other circulatory complications: Secondary | ICD-10-CM | POA: Diagnosis not present

## 2018-08-24 ENCOUNTER — Encounter: Payer: Self-pay | Admitting: Cardiology

## 2018-08-24 ENCOUNTER — Ambulatory Visit (INDEPENDENT_AMBULATORY_CARE_PROVIDER_SITE_OTHER): Payer: Medicare Other | Admitting: Cardiology

## 2018-08-24 VITALS — BP 182/80 | HR 85 | Ht 62.5 in | Wt 146.0 lb

## 2018-08-24 DIAGNOSIS — E781 Pure hyperglyceridemia: Secondary | ICD-10-CM | POA: Diagnosis not present

## 2018-08-24 DIAGNOSIS — I701 Atherosclerosis of renal artery: Secondary | ICD-10-CM

## 2018-08-24 DIAGNOSIS — I10 Essential (primary) hypertension: Secondary | ICD-10-CM

## 2018-08-24 DIAGNOSIS — F17210 Nicotine dependence, cigarettes, uncomplicated: Secondary | ICD-10-CM

## 2018-08-24 MED ORDER — CHLORTHALIDONE 25 MG PO TABS: 25.0000 mg | ORAL_TABLET | Freq: Every day | ORAL | 1 refills | Status: DC

## 2018-08-24 MED ORDER — ICOSAPENT ETHYL 1 G PO CAPS: 2.0000 g | ORAL_CAPSULE | Freq: Two times a day (BID) | ORAL | 0 refills | Status: DC

## 2018-08-24 NOTE — Addendum Note (Signed)
Addended by: Mattie Marlin on: 08/24/2018 04:13 PM   Modules accepted: Orders

## 2018-08-24 NOTE — Patient Instructions (Addendum)
Medication Instructions:  Your physician has recommended you make the following change in your medication:   START chlorthalidone 25 mg daily START vascepa 2 grams twice daily  If you need a refill on your cardiac medications before your next appointment, please call your pharmacy.   Lab work: Your physician recommends that you have the following labs drawn: BMP to be done in 1 week  If you have labs (blood work) drawn today and your tests are completely normal, you will receive your results only by: Marland Kitchen MyChart Message (if you have MyChart) OR . A paper copy in the mail If you have any lab test that is abnormal or we need to change your treatment, we will call you to review the results.  Testing/Procedures: None  Follow-Up: At [facility], you and your health needs are our priority.  As part of our continuing mission to provide you with exceptional heart care, we have created designated Provider Care Teams.  These Care Teams include your primary Cardiologist (physician) and Advanced Practice Providers (APPs -  Physician Assistants and Nurse Practitioners) who all work together to provide you with the care you need, when you need it.  You will need a follow up appointment in 6 months.  Please call our office 2 months in advance to schedule this appointment.  You may see another member of our [employer] Provider Team in [place]: Jenne Campus, MD . Shirlee More, MD  Any Other Special Instructions Will Be Listed Below (If Applicable).  Please come in 1 week for pulse, blood pressure and BMP check.   Icosapent ethyl capsules What is this medicine? ICOSAPENT ETHYL (eye KOE sa pent eth il) contains essential fats. It is used to treat high triglyceride levels. This medicine may be used for other purposes; ask your health care provider or pharmacist if you have questions. COMMON BRAND NAME(S): VASCEPA What should I tell my health care provider before I take this medicine? They  need to know if you have any of these conditions: -bleeding disorders -liver disease -an unusual or allergic reaction to icosapent ethyl, fish, shellfish, other medicines, foods, dyes, or preservatives -pregnant or trying to get pregnant -breast-feeding How should I use this medicine? Take this medicine by mouth with a glass of water. Follow the directions on the prescription label. Take this medicine with food. Do not cut, crush or chew this medicine. Take your medicine at regular intervals. Do not take it more often than directed. Do not stop taking except on your doctor's advice. Talk to your pediatrician regarding the use of this medicine in children. Special care may be needed. Overdosage: If you think you have taken too much of this medicine contact a poison control center or emergency room at once. NOTE: This medicine is only for you. Do not share this medicine with others. What if I miss a dose? If you miss a dose, take it as soon as you can. If it is almost time for your next dose, take only that dose. Do not take double or extra doses. What may interact with this medicine? This medicine may interact with the following medications: -aspirin and aspirin-like medicines -beta-blockers like metoprolol and propranolol -certain medicines that treat or prevent blood clots like warfarin, enoxaparin, dalteparin, apixaban, dabigatran, and rivaroxaban -diuretics -female hormones, like estrogens and birth control pills This list may not describe all possible interactions. Give your health care provider a list of all the medicines, herbs, non-prescription drugs, or dietary supplements you use. Also  tell them if you smoke, drink alcohol, or use illegal drugs. Some items may interact with your medicine. What should I watch for while using this medicine? You may need blood work done while you are taking this medicine. Follow a good diet and exercise plan. Taking this medicine does not replace a  healthy lifestyle. Some foods that have omega-3 fatty acids naturally are fatty fish like albacore tuna, halibut, herring, mackerel, lake trout, salmon, and sardines. If you are scheduled for any medical or dental procedure, tell your healthcare provider that you are taking this medicine. You may need to stop taking this medicine before the procedure. What side effects may I notice from receiving this medicine? Side effects that you should report to your doctor or health care professional as soon as possible: -allergic reactions like skin rash, itching or hives, swelling of the face, lips, or tongue -breathing problems -unusual bleeding or bruising Side effects that usually do not require medical attention (report to your doctor or health care professional if they continue or are bothersome): -joint pain -sore throat This list may not describe all possible side effects. Call your doctor for medical advice about side effects. You may report side effects to FDA at 1-800-FDA-1088. Where should I keep my medicine? Keep out of the reach of children. Store at room temperature between 15 and 30 degrees C (59 and 86 degrees F). Throw away any unused medicine after the expiration date. NOTE: This sheet is a summary. It may not cover all possible information. If you have questions about this medicine, talk to your doctor, pharmacist, or health care provider.  2018 Elsevier/Gold Standard (2015-10-22 13:40:36) Chlorthalidone tablets What is this medicine? CHLORTHALIDONE (klor THAL i done) is a diuretic. It increases the amount of urine passed, which causes the body to lose salt and water. This medicine is used to treat high blood pressure and edema or water retention. This medicine may be used for other purposes; ask your health care provider or pharmacist if you have questions. COMMON BRAND NAME(S): Thalitone What should I tell my health care provider before I take this medicine? They need to know if you  have any of these conditions: -asthma -diabetes -gout -kidney disease -liver disease -parathyroid disease -systemic lupus erythematosus (SLE) -taking cortisone, digoxin, lithium carbonate, or drugs for diabetes -an unusual or allergic reaction to chlorthalidone, sulfa drugs, other medicines, foods, dyes, or preservatives -pregnant or trying to get pregnant -breast-feeding How should I use this medicine? Take this medicine by mouth with a glass of water. Follow the directions on the prescription label. It is best to take your dose in the morning with food. Take your medicine at regular intervals. Do not take your medicine more often than directed. Do not stop taking except on your doctor's advice. Talk to your pediatrician regarding the use of this medicine in children. Special care may be needed. Overdosage: If you think you have taken too much of this medicine contact a poison control center or emergency room at once. NOTE: This medicine is only for you. Do not share this medicine with others. What if I miss a dose? If you miss a dose, take it as soon as you can. If it is almost time for your next dose, take only that dose. Do not take double or extra doses. What may interact with this medicine? -barbiturate medicines for sleep or seizure control -digoxin -lithium -medicines for diabetes -norepinephrine -other medicines for high blood pressure -some pain medicines -steroid hormones like  prednisone, cortisone, hydrocortisone, corticotropin -tubocurarine This list may not describe all possible interactions. Give your health care provider a list of all the medicines, herbs, non-prescription drugs, or dietary supplements you use. Also tell them if you smoke, drink alcohol, or use illegal drugs. Some items may interact with your medicine. What should I watch for while using this medicine? Visit your doctor or health care professional for regular check ups. Check your blood pressure as  directed. Ask your doctor or health care professional what your blood pressure should be and when you should contact him or her. You may need to be on a special diet while taking this medicine. Ask your doctor. You may get drowsy or dizzy. Do not drive, use machinery, or do anything that needs mental alertness until you know how this medicine affects you. Do not stand or sit up quickly, especially if you are an older patient. This reduces the risk of dizzy or fainting spells. Alcohol may interfere with the effect of this medicine. Avoid alcoholic drinks. This medicine may affect your blood sugar level. If you have diabetes, check with your doctor or health care professional before changing the dose of your diabetic medicine. This medicine can make you more sensitive to the sun. Keep out of the sun. If you cannot avoid being in the sun, wear protective clothing and use sunscreen. Do not use sun lamps or tanning beds/booths. What side effects may I notice from receiving this medicine? Side effects that you should report to your doctor or health care professional as soon as possible: -allergic reactions like skin rash, itching or hives, swelling of the face, lips, or tongue -dark urine -dry mouth -excess thirst -fast, irregular heart rate -fever, chills -muscle pain, cramps, or spasm -nausea, vomiting -redness, blistering, peeling or loosening of the skin, including inside the mouth -tingling, pain or numbness in the hands or feet -unusually weak or tired -yellowing of the eyes or skin Side effects that usually do not require medical attention (report to your doctor or health care professional if they continue or are bothersome): -diarrhea or constipation -headache -impotence -loss of appetite -stomach upset This list may not describe all possible side effects. Call your doctor for medical advice about side effects. You may report side effects to FDA at 1-800-FDA-1088. Where should I keep my  medicine? Keep out of the reach of children. Store at room temperature between 15 and 30 degrees C (59 and 86 degrees F). Keep container tightly closed. Throw away any unused medicine after the expiration date. NOTE: This sheet is a summary. It may not cover all possible information. If you have questions about this medicine, talk to your doctor, pharmacist, or health care provider.  2018 Elsevier/Gold Standard (2007-12-25 15:28:48)

## 2018-08-24 NOTE — Progress Notes (Signed)
Cardiology Office Note:    Date:  08/24/2018   ID:  ZIANA HEYLIGER, DOB 1946/09/15, MRN [id number]  PCP:  Marco Collie, MD  Cardiologist:  Jenean Lindau, MD   Referring MD: Maryella Shivers, MD    ASSESSMENT:    1. Essential hypertension   2. Atherosclerosis of renal artery ([place])   3. Cigarette smoker   4. Hypertriglyceridemia    PLAN:    In order of problems listed above:  1. Secondary prevention stressed with the patient.  Importance of compliance with diet and medical vision stressed and she vocalized understanding.  Diet was discussed with dyslipidemia and obesity and weight reduction was stressed and she verbalized understanding. 2. I spent 5 minutes with the patient discussing solely about smoking. Smoking cessation was counseled. I suggested to the patient also different medications and pharmacological interventions. Patient is keen to try stopping on its own at this time. He will get back to me if he needs any further assistance in this matter. 3. In view of elevated blood pressure, I has checked her machine and it is showing appropriate blood pressure as we have not our clinic today.  I have added low-dose chlorthalidone and she will be back in 1 week for a pulse blood pressure check and a Chem-7.  She will keep track of her blood pressures. 4. Diet was discussed for dyslipidemia and we initiated on Vascepa 2 g twice daily. 5. Patient will be seen in follow-up appointment in 6 months or earlier if the patient has any concerns 6.    Medication Adjustments/Labs and Tests Ordered: Current medicines are reviewed at length with the patient today.  Concerns regarding medicines are outlined above.  No orders of the defined types were placed in this encounter.  No orders of the defined types were placed in this encounter.    No chief complaint on file.    History of Present Illness:    Lindsey [name] is a 72 y.o. female.  Patient has atherosclerotic vascular disease  and she has seen her primary care physician and also underwent renal artery stenting for renal artery stenosis.  She has market hypertriglyceridemia and unfortunately leads a sedentary lifestyle.  She is overweight and smokes heavily.  She denies any chest pain orthopnea or PND.  At the time of my evaluation, the patient is alert awake oriented and in no distress.  Past Medical History:  Diagnosis Date  . Anxiety   . Arthritis   . Asthma   . COPD (chronic obstructive pulmonary disease) ([place])   . Fibromyalgia   . Hyperlipidemia   . Hypertension   . Prediabetes     Past Surgical History:  Procedure Laterality Date  . ABDOMINAL HYSTERECTOMY    . APPENDECTOMY  2009  . INCISIONAL HERNIA REPAIR    . PR VEIN BYPASS GRAFT,AORTO-FEM-POP  1992   by Dr. Scot Dock  . RENAL ANGIOGRAPHY N/A 04/30/2018   Procedure: RENAL ANGIOGRAPHY;  Surgeon: Waynetta Sandy, MD;  Location: [facility];  Service: Cardiovascular;  Laterality: N/A;  . RENAL ARTERY STENT  2009   Left renal artery   done by Dr. Amedeo Plenty  . VASCULAR SURGERY      Current Medications: Current Meds  Medication Sig  . ALPRAZolam (XANAX) 0.5 MG tablet Take 0.5 mg by mouth daily. Take one tablet (0.25 mg) by mouth twice daily - 1330 and 230  . aspirin 325 MG EC tablet Take 325 mg by mouth daily at 2 PM.   .  ezetimibe (ZETIA) 10 MG tablet Take 10 mg by mouth at bedtime. Take one tablet (10 mg) by mouth daily at 1:30am  . hydrALAZINE (APRESOLINE) 10 MG tablet Take 20 mg by mouth 2 (two) times daily.   . Multiple Vitamins-Iron (MULTIPLE VITAMIN/IRON PO) Take 1 tablet by mouth daily at 2 PM.   . omeprazole (PRILOSEC) 20 MG capsule Take 20 mg by mouth 2 (two) times daily before a meal. Take one capsule (20 mg) by mouth daily at 10:30pm  . valsartan (DIOVAN) 320 MG tablet Take 320 mg by mouth daily at 2 PM.      Allergies:   Ace inhibitors; Acyclovir and related; Baclofen; Beta adrenergic blockers; Biaxin [clarithromycin]; Buspar  [buspirone]; Calcium channel blockers; Claritin [loratadine]; Codeine; Colestid [colestipol hcl]; Combivent [ipratropium-albuterol]; Crestor [rosuvastatin calcium]; Cymbalta [duloxetine hcl]; Decongestant-antihistamine [triprolidine-pseudoephedrine]; Depo-medrol [methylprednisolone]; Efudex [fluorouracil]; Fenofibrate; Flagyl [metronidazole]; Flexeril [cyclobenzaprine]; Flonase [fluticasone propionate]; Gabapentin; Hctz [hydrochlorothiazide]; Hydrocodone; Hydroxyzine; Isosorbide; Levsin [hyoscyamine sulfate]; Losartan potassium; Lotrel [amlodipine besy-benazepril hcl]; Macrobid [nitrofurantoin]; Meclizine; Nasonex [mometasone furoate]; Niaspan [niacin]; Norvasc [amlodipine besylate]; Paxil [paroxetine hcl]; Phenergan [promethazine hcl]; Premarin [conjugated estrogens]; Red dye; Relafen [nabumetone]; Singulair [montelukast sodium]; Spiriva [tiotropium bromide monohydrate]; Statins; Sulfa antibiotics; Tequin [gatifloxacin]; Tramadol; Transderm-scop [scopolamine]; Trazodone and nefazodone; Triamcinolone; Venlafaxine; Zyrtec [cetirizine]; Advair diskus [fluticasone-salmeterol]; Amoxicillin; Augmentin [amoxicillin-pot clavulanate]; Ephedrine; Latex; and Prednisone   Social History   Socioeconomic History  . Marital status: Divorced    Spouse name: Not on file  . Number of children: Not on file  . Years of education: Not on file  . Highest education level: Not on file  Occupational History  . Not on file  Social Needs  . Financial resource strain: Not on file  . Food insecurity:    Worry: Not on file    Inability: Not on file  . Transportation needs:    Medical: Not on file    Non-medical: Not on file  Tobacco Use  . Smoking status: Current Every Day Smoker    Packs/day: 1.50    Types: Cigarettes  . Smokeless tobacco: Never Used  Substance and Sexual Activity  . Alcohol use: No    Alcohol/week: 0.0 standard drinks  . Drug use: No  . Sexual activity: Not on file  Lifestyle  . Physical  activity:    Days per week: Not on file    Minutes per session: Not on file  . Stress: Not on file  Relationships  . Social connections:    Talks on phone: Not on file    Gets together: Not on file    Attends religious service: Not on file    Active member of club or organization: Not on file    Attends meetings of clubs or organizations: Not on file    Relationship status: Not on file  Other Topics Concern  . Not on file  Social History Narrative  . Not on file     Family History: The patient's family history includes Diabetes in her other; Stroke in her other.  ROS:   Please see the history of present illness.    All other systems reviewed and are negative.  EKGs/Labs/Other Studies Reviewed:    The following studies were reviewed today: I discussed my findings with the patient at extensive length.  EKG reveals sinus rhythm and nonspecific ST-T changes   Recent Labs: 04/29/2018: ALT 13; BUN 13; Creatinine, Ser 1.33; Hemoglobin 13.2; Platelets 371; Potassium 4.5; Sodium 135  Recent Lipid Panel No results found for: CHOL, TRIG, HDL, CHOLHDL, VLDL,  LDLCALC, LDLDIRECT  Physical Exam:    VS:  BP (!) 182/80 (BP Location: Right Arm, Patient Position: Sitting, Cuff Size: Normal)   Pulse 85   Ht 5' 2.5" (1.588 m)   Wt 146 lb (66.2 kg)   SpO2 99%   BMI 26.28 kg/m     Wt Readings from Last 3 Encounters:  08/24/18 146 lb (66.2 kg)  04/29/18 148 lb 5.9 oz (67.3 kg)  03/23/18 146 lb 8 oz (66.5 kg)     GEN: Patient is in no acute distress HEENT: Normal NECK: No JVD; No carotid bruits LYMPHATICS: No lymphadenopathy CARDIAC: Hear sounds regular, 2/6 systolic murmur at the apex. RESPIRATORY:  Clear to auscultation without rales, wheezing or rhonchi  ABDOMEN: Soft, non-tender, non-distended MUSCULOSKELETAL:  No edema; No deformity  SKIN: Warm and dry NEUROLOGIC:  Alert and oriented x 3 PSYCHIATRIC:  Normal affect   Signed, Jenean Lindau, MD  08/24/2018 4:05 PM      [facility]

## 2018-09-03 ENCOUNTER — Ambulatory Visit: Payer: Medicare Other

## 2018-09-04 DIAGNOSIS — F419 Anxiety disorder, unspecified: Secondary | ICD-10-CM | POA: Diagnosis not present

## 2018-09-05 DIAGNOSIS — I10 Essential (primary) hypertension: Secondary | ICD-10-CM | POA: Diagnosis not present

## 2018-09-05 DIAGNOSIS — E7849 Other hyperlipidemia: Secondary | ICD-10-CM | POA: Diagnosis not present

## 2018-09-05 DIAGNOSIS — Z9114 Patient's other noncompliance with medication regimen: Secondary | ICD-10-CM | POA: Diagnosis not present

## 2018-09-05 DIAGNOSIS — E1159 Type 2 diabetes mellitus with other circulatory complications: Secondary | ICD-10-CM | POA: Diagnosis not present

## 2018-09-11 ENCOUNTER — Ambulatory Visit (INDEPENDENT_AMBULATORY_CARE_PROVIDER_SITE_OTHER): Payer: Medicare Other | Admitting: Cardiology

## 2018-09-11 ENCOUNTER — Encounter: Payer: Self-pay | Admitting: Cardiology

## 2018-09-11 VITALS — BP 180/62 | HR 95 | Ht 62.5 in | Wt 147.0 lb

## 2018-09-11 DIAGNOSIS — I701 Atherosclerosis of renal artery: Secondary | ICD-10-CM | POA: Diagnosis not present

## 2018-09-11 DIAGNOSIS — F17210 Nicotine dependence, cigarettes, uncomplicated: Secondary | ICD-10-CM | POA: Diagnosis not present

## 2018-09-11 DIAGNOSIS — E781 Pure hyperglyceridemia: Secondary | ICD-10-CM

## 2018-09-11 DIAGNOSIS — I10 Essential (primary) hypertension: Secondary | ICD-10-CM | POA: Diagnosis not present

## 2018-09-11 NOTE — Patient Instructions (Signed)
Medication Instructions:  Your physician has recommended you make the following change in your medication:   STOP: chlorthalidone   If you need a refill on your cardiac medications before your next appointment, please call your pharmacy.   Lab work: Your physician recommends that you return for lab work today: BMP If you have labs (blood work) drawn today and your tests are completely normal, you will receive your results only by: Marland Kitchen MyChart Message (if you have MyChart) OR . A paper copy in the mail If you have any lab test that is abnormal or we need to change your treatment, we will call you to review the results.  Testing/Procedures: Your physician has requested that you have a carotid duplex. This test is an ultrasound of the carotid arteries in your neck. It looks at blood flow through these arteries that supply the brain with blood. Allow one hour for this exam. There are no restrictions or special instructions.    Follow-Up: At [facility], you and your health needs are our priority.  As part of our continuing mission to provide you with exceptional heart care, we have created designated Provider Care Teams.  These Care Teams include your primary Cardiologist (physician) and Advanced Practice Providers (APPs -  Physician Assistants and Nurse Practitioners) who all work together to provide you with the care you need, when you need it. You will need a follow up appointment in 6 months.  Please call our office 2 months in advance to schedule this appointment.  You may see No primary care provider on file. or another member of our [employer] Provider Team in [place]: Jenne Campus, MD . Shirlee More, MD  Any Other Special Instructions Will Be Listed Below (If Applicable).  Carotid Artery Disease The carotid arteries are the two main arteries on either side of the neck that supply blood to the brain. Carotid artery disease, also called carotid artery stenosis, is the  narrowing or blockage of one or both carotid arteries. Carotid artery disease increases your risk for a stroke or a transient ischemic attack (TIA). A TIA is an episode in which a waxy, fatty substance that accumulates within the artery (plaque) blocks blood flow to the brain. A TIA is considered a "warning stroke." What are the causes?  Buildup of plaque inside the carotid arteries (atherosclerosis) (common).  A weakened outpouching in an artery (aneurysm).  Inflammation of the carotid artery (arteritis).  A fibrous growth within the carotid artery (fibromuscular dysplasia).  Tissue death within the carotid artery due to radiation treatment (post-radiation necrosis).  Decreased blood flow due to spasms of the carotid artery (vasospasm).  Separation of the walls of the carotid artery (carotid dissection). What increases the risk?  High cholesterol (dyslipidemia).  High blood pressure (hypertension).  Smoking.  Obesity.  Diabetes.  Family history of cardiovascular disease.  Inactivity or lack of regular exercise.  Being female. Men have an increased risk of developing atherosclerosis earlier in life than women. What are the signs or symptoms? Carotid artery disease does not cause symptoms. How is this diagnosed? Diagnosis of carotid artery disease may include:  A physical exam. Your health care provider may hear an abnormal sound (bruit) when listening to the carotid arteries.  Specific tests that look at the blood flow in the carotid arteries. These tests include: ? Carotid artery ultrasonography. ? Carotid or cerebral angiography. ? Computerized tomographic angiography (CTA). ? Magnetic resonance angiography (MRA).  How is this treated? Treatment of carotid artery disease  can include a combination of treatments. Treatment options include:  Surgery. You may have: ? A carotid endarterectomy. This is a surgery to remove the blockages in the carotid arteries. ? A carotid  angioplasty with stenting. This is a nonsurgical interventional procedure. A wire mesh (stent) is used to widen the blocked carotid arteries.  Medicines to control blood pressure, cholesterol, and reduce blood clotting (antiplatelet therapy).  Adjusting your diet.  Lifestyle changes such as: ? Quitting smoking. ? Exercising as tolerated or as directed by your health care provider. ? Controlling and maintaining a good blood pressure. ? Keeping cholesterol levels under control.  Follow these instructions at home:  Take medicines only as directed by your health care provider. Make sure you understand all your medicine instructions. Do not stop your medicines without talking to your health care provider.  Follow your health care provider's diet instructions. It is important to eat a healthy diet that is low in saturated fats and includes plenty of fresh fruits, vegetables, and lean meats. High-fat, high-sodium foods as well as foods that are fried, overly processed, or have poor nutritional value should be avoided.  Maintain a healthy weight.  Stay physically active. It is recommended that you get at least 30 minutes of activity every day.  Do not use any tobacco products including cigarettes, chewing tobacco, or electronic cigarettes. If you need help quitting, ask your health care provider.  Limit alcohol use to: ? No more than 2 drinks per day for men. ? No more than 1 drink per day for nonpregnant women.  Do not use illegal drugs.  Keep all follow-up visits as directed by your health care provider. Get help right away if: You develop TIA or stroke symptoms. These include:  Sudden weakness or numbness on one side of the body, such as in the face, arm, or leg.  Sudden confusion.  Trouble speaking (aphasia) or understanding.  Sudden trouble seeing out of one or both eyes.  Sudden trouble walking.  Dizziness or feeling like you might faint.  Loss of balance or  coordination.  Sudden severe headache with no known cause.  Sudden trouble swallowing (dysphagia).  If you have any of these symptoms, call your local emergency services (911 in [place] Do not drive yourself to the clinic or hospital. This is a medical emergency. This information is not intended to replace advice given to you by your health care provider. Make sure you discuss any questions you have with your health care provider. Document Released: 12/12/2011 Document Revised: 02/25/2016 Document Reviewed: 03/20/2013 Elsevier Interactive Patient Education  2017 [employer].

## 2018-09-11 NOTE — Addendum Note (Signed)
Addended by: Ashok Norris on: 09/11/2018 02:18 PM   Modules accepted: Orders

## 2018-09-11 NOTE — Progress Notes (Signed)
Cardiology Office Note:    Date:  09/11/2018   ID:  Lindsey [name], DOB 06-28-1946, MRN [id number]  PCP:  Marco Collie, MD  Cardiologist:  Jenean Lindau, MD   Referring MD: Marco Collie, MD    ASSESSMENT:    1. Essential hypertension   2. Cigarette smoker   3. Renal artery stenosis ([place])   4. Hypertriglyceridemia    PLAN:    In order of problems listed above:  1. I reviewed patient's records and her blood pressures are stable in the range of 222-979 systolic and in the 89Q and diastolic.  She is happy with these readings.  Her hydralazine was increased and she could not tolerate it.  She could not tolerate the chlorthalidone either.  She will continue current medications and I will do a Chem-7 today. 2. In view of bilateral carotid bruit she will have a carotid ultrasound. 3. I did advise her to see a vascular surgeon that she has been seeing over the past several years and be followed by them for her vascular disease including abdominal aortic and peripheral vascular and she is agreeable.  She will make up an appointment with them as she has done in the past. 4. She cannot tolerate any other lipid-lowering medications and is not keen on injectable therapy.  I counseled her again on quitting smoking and she is planning to do so though she is at this reluctantly. 5. Patient will be seen in follow-up appointment in 6 months or earlier if the patient has any concerns    Medication Adjustments/Labs and Tests Ordered: Current medicines are reviewed at length with the patient today.  Concerns regarding medicines are outlined above.  No orders of the defined types were placed in this encounter.  No orders of the defined types were placed in this encounter.    No chief complaint on file.    History of Present Illness:    Lindsey [name] is a 72 y.o. female.  Patient has multiple vascular issues including carotid artery disease, aortoiliac disease based on her history.  She has  essential hypertension.  She is intolerant to multiple medications.  She is tolerating her current medications valsartan and hydralazine well.  No chest pain orthopnea or PND.  Unfortunately she continues to smoke heavily.  At the time of my evaluation, the patient is alert awake oriented and in no distress.  Past Medical History:  Diagnosis Date  . Anxiety   . Arthritis   . Asthma   . COPD (chronic obstructive pulmonary disease) ([place])   . Fibromyalgia   . Hyperlipidemia   . Hypertension   . Prediabetes     Past Surgical History:  Procedure Laterality Date  . ABDOMINAL HYSTERECTOMY    . APPENDECTOMY  2009  . INCISIONAL HERNIA REPAIR    . PR VEIN BYPASS GRAFT,AORTO-FEM-POP  1992   by Dr. Scot Dock  . RENAL ANGIOGRAPHY N/A 04/30/2018   Procedure: RENAL ANGIOGRAPHY;  Surgeon: Waynetta Sandy, MD;  Location: [facility];  Service: Cardiovascular;  Laterality: N/A;  . RENAL ARTERY STENT  2009   Left renal artery   done by Dr. Amedeo Plenty  . VASCULAR SURGERY      Current Medications: Current Meds  Medication Sig  . ALPRAZolam (XANAX) 0.5 MG tablet Take 0.5 mg by mouth daily. Take one tablet (0.25 mg) by mouth twice daily - 1330 and 230  . aspirin 325 MG EC tablet Take 325 mg by mouth daily at 2  PM.   . ezetimibe (ZETIA) 10 MG tablet Take 10 mg by mouth at bedtime. Take one tablet (10 mg) by mouth daily at 1:30am  . hydrALAZINE (APRESOLINE) 10 MG tablet Take 25 mg by mouth 2 (two) times daily.   Marland Kitchen omeprazole (PRILOSEC) 20 MG capsule Take 20 mg by mouth 2 (two) times daily before a meal. Take one capsule (20 mg) by mouth daily at 10:30pm  . valsartan (DIOVAN) 320 MG tablet Take 320 mg by mouth daily at 2 PM.      Allergies:   Ace inhibitors; Acyclovir and related; Baclofen; Beta adrenergic blockers; Biaxin [clarithromycin]; Buspar [buspirone]; Calcium channel blockers; Claritin [loratadine]; Codeine; Colestid [colestipol hcl]; Combivent [ipratropium-albuterol]; Crestor  [rosuvastatin calcium]; Cymbalta [duloxetine hcl]; Decongestant-antihistamine [triprolidine-pseudoephedrine]; Depo-medrol [methylprednisolone]; Efudex [fluorouracil]; Fenofibrate; Flagyl [metronidazole]; Flexeril [cyclobenzaprine]; Flonase [fluticasone propionate]; Gabapentin; Hctz [hydrochlorothiazide]; Hydrocodone; Hydroxyzine; Isosorbide; Levsin [hyoscyamine sulfate]; Losartan potassium; Lotrel [amlodipine besy-benazepril hcl]; Macrobid [nitrofurantoin]; Meclizine; Nasonex [mometasone furoate]; Niaspan [niacin]; Norvasc [amlodipine besylate]; Paxil [paroxetine hcl]; Phenergan [promethazine hcl]; Premarin [conjugated estrogens]; Red dye; Relafen [nabumetone]; Singulair [montelukast sodium]; Spiriva [tiotropium bromide monohydrate]; Statins; Sulfa antibiotics; Tequin [gatifloxacin]; Tramadol; Transderm-scop [scopolamine]; Trazodone and nefazodone; Triamcinolone; Venlafaxine; Zyrtec [cetirizine]; Advair diskus [fluticasone-salmeterol]; Amoxicillin; Augmentin [amoxicillin-pot clavulanate]; Ephedrine; Latex; and Prednisone   Social History   Socioeconomic History  . Marital status: Divorced    Spouse name: Not on file  . Number of children: Not on file  . Years of education: Not on file  . Highest education level: Not on file  Occupational History  . Not on file  Social Needs  . Financial resource strain: Not on file  . Food insecurity:    Worry: Not on file    Inability: Not on file  . Transportation needs:    Medical: Not on file    Non-medical: Not on file  Tobacco Use  . Smoking status: Current Every Day Smoker    Packs/day: 1.50    Types: Cigarettes  . Smokeless tobacco: Never Used  Substance and Sexual Activity  . Alcohol use: No    Alcohol/week: 0.0 standard drinks  . Drug use: No  . Sexual activity: Not on file  Lifestyle  . Physical activity:    Days per week: Not on file    Minutes per session: Not on file  . Stress: Not on file  Relationships  . Social connections:     Talks on phone: Not on file    Gets together: Not on file    Attends religious service: Not on file    Active member of club or organization: Not on file    Attends meetings of clubs or organizations: Not on file    Relationship status: Not on file  Other Topics Concern  . Not on file  Social History Narrative  . Not on file     Family History: The patient's family history includes Diabetes in her other; Stroke in her other.  ROS:   Please see the history of present illness.    All other systems reviewed and are negative.  EKGs/Labs/Other Studies Reviewed:    The following studies were reviewed today: I discussed my findings with the patient at extensive length.   Recent Labs: 04/29/2018: ALT 13; BUN 13; Creatinine, Ser 1.33; Hemoglobin 13.2; Platelets 371; Potassium 4.5; Sodium 135  Recent Lipid Panel No results found for: CHOL, TRIG, HDL, CHOLHDL, VLDL, LDLCALC, LDLDIRECT  Physical Exam:    VS:  BP (!) 180/62 (BP Location: Right Arm, Patient Position: Sitting, Cuff Size:  Normal)   Pulse 95   Ht 5' 2.5" (1.588 m)   Wt 147 lb (66.7 kg)   SpO2 98%   BMI 26.46 kg/m     Wt Readings from Last 3 Encounters:  09/11/18 147 lb (66.7 kg)  08/24/18 146 lb (66.2 kg)  04/29/18 148 lb 5.9 oz (67.3 kg)     GEN: Patient is in no acute distress HEENT: Normal NECK: No JVD; No carotid bruits LYMPHATICS: No lymphadenopathy CARDIAC: Hear sounds regular, 2/6 systolic murmur at the apex. RESPIRATORY:  Clear to auscultation without rales, wheezing or rhonchi  ABDOMEN: Soft, non-tender, non-distended MUSCULOSKELETAL:  No edema; No deformity  SKIN: Warm and dry NEUROLOGIC:  Alert and oriented x 3 PSYCHIATRIC:  Normal affect   Signed, Jenean Lindau, MD  09/11/2018 2:06 PM    [place]

## 2018-09-12 LAB — BASIC METABOLIC PANEL
BUN / CREAT RATIO: 19 (ref 12–28)
BUN: 31 mg/dL — ABNORMAL HIGH (ref 8–27)
CHLORIDE: 98 mmol/L (ref 96–106)
CO2: 17 mmol/L — ABNORMAL LOW (ref 20–29)
Calcium: 9.6 mg/dL (ref 8.7–10.3)
Creatinine, Ser: 1.62 mg/dL — ABNORMAL HIGH (ref 0.57–1.00)
GFR calc non Af Amer: 31 mL/min/{1.73_m2} — ABNORMAL LOW (ref >59)
GFR, EST AFRICAN AMERICAN: 36 mL/min/{1.73_m2} — AB (ref >59)
Glucose: 119 mg/dL — ABNORMAL HIGH (ref 65–99)
Potassium: 5.2 mmol/L (ref 3.5–5.2)
SODIUM: 129 mmol/L — AB (ref 134–144)

## 2018-09-13 DIAGNOSIS — E871 Hypo-osmolality and hyponatremia: Secondary | ICD-10-CM | POA: Diagnosis not present

## 2018-09-13 DIAGNOSIS — E1159 Type 2 diabetes mellitus with other circulatory complications: Secondary | ICD-10-CM | POA: Diagnosis not present

## 2018-09-13 DIAGNOSIS — I10 Essential (primary) hypertension: Secondary | ICD-10-CM | POA: Diagnosis not present

## 2018-09-13 DIAGNOSIS — I701 Atherosclerosis of renal artery: Secondary | ICD-10-CM | POA: Diagnosis not present

## 2018-09-14 ENCOUNTER — Telehealth: Payer: Self-pay

## 2018-09-14 NOTE — Telephone Encounter (Signed)
-----   Message from Jenean Lindau, MD sent at 09/12/2018  5:09 PM EST ----- Regarding: RE: vascular  She has complex vascular problems and I do not feel comfortable or complete and handling them.  I have told her repeatedly that she needs to get established with a vascular surgeon as she has done in the past. ----- Message ----- From: Mattie Marlin, RN Sent: 09/12/2018   4:49 PM EST To: Jenean Lindau, MD Subject: vascular                                       Patient requests that you take over her vascular care. She is hoping to have care closer to home. Stated that you mentioned to her about possibly having a CT done.

## 2018-09-14 NOTE — Telephone Encounter (Signed)
Informed the patient of Dr. Julien Nordmann concerns regarding her complex history. Patient states that her PCP will be ordering the CT scan in a couple of weeks then sending [place] the results. The patient states that she will be looking for a new vascular surgeon as the one she had previously would not work with her regarding times of appointments. Patient may decide to take her care to a vascular surgeon with [facility] but patient will call and let us know.

## 2018-09-24 DIAGNOSIS — I701 Atherosclerosis of renal artery: Secondary | ICD-10-CM | POA: Diagnosis not present

## 2018-09-24 DIAGNOSIS — N281 Cyst of kidney, acquired: Secondary | ICD-10-CM | POA: Diagnosis not present

## 2018-10-01 DIAGNOSIS — Q613 Polycystic kidney, unspecified: Secondary | ICD-10-CM | POA: Diagnosis not present

## 2018-10-01 DIAGNOSIS — I701 Atherosclerosis of renal artery: Secondary | ICD-10-CM | POA: Diagnosis not present

## 2018-10-01 DIAGNOSIS — Z6827 Body mass index (BMI) 27.0-27.9, adult: Secondary | ICD-10-CM | POA: Diagnosis not present

## 2018-10-01 DIAGNOSIS — I700 Atherosclerosis of aorta: Secondary | ICD-10-CM | POA: Diagnosis not present

## 2018-10-24 DIAGNOSIS — I150 Renovascular hypertension: Secondary | ICD-10-CM | POA: Diagnosis not present

## 2018-10-24 DIAGNOSIS — I10 Essential (primary) hypertension: Secondary | ICD-10-CM | POA: Diagnosis not present

## 2018-10-24 DIAGNOSIS — F17210 Nicotine dependence, cigarettes, uncomplicated: Secondary | ICD-10-CM | POA: Diagnosis not present

## 2018-10-24 DIAGNOSIS — F419 Anxiety disorder, unspecified: Secondary | ICD-10-CM | POA: Diagnosis not present

## 2018-10-29 DIAGNOSIS — I701 Atherosclerosis of renal artery: Secondary | ICD-10-CM | POA: Diagnosis not present

## 2018-10-29 DIAGNOSIS — I160 Hypertensive urgency: Secondary | ICD-10-CM | POA: Diagnosis not present

## 2018-10-29 DIAGNOSIS — I10 Essential (primary) hypertension: Secondary | ICD-10-CM | POA: Diagnosis not present

## 2018-10-29 DIAGNOSIS — E1159 Type 2 diabetes mellitus with other circulatory complications: Secondary | ICD-10-CM | POA: Diagnosis not present

## 2018-10-29 DIAGNOSIS — F17200 Nicotine dependence, unspecified, uncomplicated: Secondary | ICD-10-CM | POA: Diagnosis not present

## 2018-11-12 DIAGNOSIS — I169 Hypertensive crisis, unspecified: Secondary | ICD-10-CM | POA: Diagnosis not present

## 2018-11-12 DIAGNOSIS — Z139 Encounter for screening, unspecified: Secondary | ICD-10-CM | POA: Diagnosis not present

## 2018-11-12 DIAGNOSIS — E1159 Type 2 diabetes mellitus with other circulatory complications: Secondary | ICD-10-CM | POA: Diagnosis not present

## 2018-11-12 DIAGNOSIS — Z1331 Encounter for screening for depression: Secondary | ICD-10-CM | POA: Diagnosis not present

## 2018-11-12 DIAGNOSIS — Z0000 Encounter for general adult medical examination without abnormal findings: Secondary | ICD-10-CM | POA: Diagnosis not present

## 2018-11-12 DIAGNOSIS — I10 Essential (primary) hypertension: Secondary | ICD-10-CM | POA: Diagnosis not present

## 2018-11-13 DIAGNOSIS — I10 Essential (primary) hypertension: Secondary | ICD-10-CM | POA: Diagnosis not present

## 2018-11-13 DIAGNOSIS — E119 Type 2 diabetes mellitus without complications: Secondary | ICD-10-CM | POA: Diagnosis not present

## 2018-11-13 DIAGNOSIS — J449 Chronic obstructive pulmonary disease, unspecified: Secondary | ICD-10-CM | POA: Diagnosis not present

## 2018-11-13 DIAGNOSIS — Z79899 Other long term (current) drug therapy: Secondary | ICD-10-CM | POA: Diagnosis not present

## 2018-11-13 DIAGNOSIS — F17210 Nicotine dependence, cigarettes, uncomplicated: Secondary | ICD-10-CM | POA: Diagnosis not present

## 2018-11-14 ENCOUNTER — Ambulatory Visit: Payer: Medicare Other | Admitting: Cardiology

## 2018-11-14 DIAGNOSIS — E1159 Type 2 diabetes mellitus with other circulatory complications: Secondary | ICD-10-CM | POA: Diagnosis not present

## 2018-11-14 DIAGNOSIS — E782 Mixed hyperlipidemia: Secondary | ICD-10-CM | POA: Diagnosis not present

## 2018-11-16 DIAGNOSIS — I151 Hypertension secondary to other renal disorders: Secondary | ICD-10-CM | POA: Diagnosis not present

## 2018-11-23 DIAGNOSIS — I701 Atherosclerosis of renal artery: Secondary | ICD-10-CM | POA: Diagnosis not present

## 2018-11-26 DIAGNOSIS — E7801 Familial hypercholesterolemia: Secondary | ICD-10-CM | POA: Diagnosis not present

## 2018-11-26 DIAGNOSIS — I150 Renovascular hypertension: Secondary | ICD-10-CM | POA: Diagnosis not present

## 2018-11-26 DIAGNOSIS — F17200 Nicotine dependence, unspecified, uncomplicated: Secondary | ICD-10-CM | POA: Diagnosis not present

## 2018-11-26 DIAGNOSIS — K140 Glossitis: Secondary | ICD-10-CM | POA: Diagnosis not present

## 2018-11-26 DIAGNOSIS — I701 Atherosclerosis of renal artery: Secondary | ICD-10-CM | POA: Diagnosis not present

## 2018-11-27 DIAGNOSIS — I701 Atherosclerosis of renal artery: Secondary | ICD-10-CM | POA: Diagnosis not present

## 2018-11-30 DIAGNOSIS — H25813 Combined forms of age-related cataract, bilateral: Secondary | ICD-10-CM | POA: Diagnosis not present

## 2018-11-30 DIAGNOSIS — H40053 Ocular hypertension, bilateral: Secondary | ICD-10-CM | POA: Diagnosis not present

## 2018-11-30 DIAGNOSIS — E119 Type 2 diabetes mellitus without complications: Secondary | ICD-10-CM | POA: Diagnosis not present

## 2018-11-30 DIAGNOSIS — H35032 Hypertensive retinopathy, left eye: Secondary | ICD-10-CM | POA: Diagnosis not present

## 2018-11-30 DIAGNOSIS — H524 Presbyopia: Secondary | ICD-10-CM | POA: Diagnosis not present

## 2018-12-05 DIAGNOSIS — I701 Atherosclerosis of renal artery: Secondary | ICD-10-CM | POA: Diagnosis not present

## 2018-12-07 DIAGNOSIS — I701 Atherosclerosis of renal artery: Secondary | ICD-10-CM | POA: Diagnosis not present

## 2018-12-20 DIAGNOSIS — I6523 Occlusion and stenosis of bilateral carotid arteries: Secondary | ICD-10-CM | POA: Diagnosis not present

## 2018-12-20 DIAGNOSIS — I700 Atherosclerosis of aorta: Secondary | ICD-10-CM | POA: Diagnosis not present

## 2018-12-20 DIAGNOSIS — F17210 Nicotine dependence, cigarettes, uncomplicated: Secondary | ICD-10-CM | POA: Diagnosis not present

## 2018-12-20 DIAGNOSIS — I739 Peripheral vascular disease, unspecified: Secondary | ICD-10-CM | POA: Diagnosis not present

## 2018-12-20 DIAGNOSIS — I701 Atherosclerosis of renal artery: Secondary | ICD-10-CM | POA: Diagnosis not present

## 2018-12-20 DIAGNOSIS — Z95828 Presence of other vascular implants and grafts: Secondary | ICD-10-CM | POA: Diagnosis not present

## 2018-12-20 DIAGNOSIS — N189 Chronic kidney disease, unspecified: Secondary | ICD-10-CM | POA: Diagnosis not present

## 2018-12-20 DIAGNOSIS — Z95820 Peripheral vascular angioplasty status with implants and grafts: Secondary | ICD-10-CM | POA: Diagnosis not present

## 2018-12-20 DIAGNOSIS — I129 Hypertensive chronic kidney disease with stage 1 through stage 4 chronic kidney disease, or unspecified chronic kidney disease: Secondary | ICD-10-CM | POA: Diagnosis not present

## 2018-12-24 DIAGNOSIS — F4310 Post-traumatic stress disorder, unspecified: Secondary | ICD-10-CM | POA: Diagnosis not present

## 2019-01-01 DIAGNOSIS — Q251 Coarctation of aorta: Secondary | ICD-10-CM | POA: Diagnosis not present

## 2019-01-01 DIAGNOSIS — I150 Renovascular hypertension: Secondary | ICD-10-CM | POA: Diagnosis not present

## 2019-01-01 DIAGNOSIS — I739 Peripheral vascular disease, unspecified: Secondary | ICD-10-CM | POA: Diagnosis not present

## 2019-01-01 DIAGNOSIS — I701 Atherosclerosis of renal artery: Secondary | ICD-10-CM | POA: Diagnosis not present

## 2019-01-22 DIAGNOSIS — I701 Atherosclerosis of renal artery: Secondary | ICD-10-CM | POA: Diagnosis not present

## 2019-01-22 DIAGNOSIS — I739 Peripheral vascular disease, unspecified: Secondary | ICD-10-CM | POA: Diagnosis not present

## 2019-01-22 DIAGNOSIS — Z95828 Presence of other vascular implants and grafts: Secondary | ICD-10-CM | POA: Diagnosis not present

## 2019-01-28 DIAGNOSIS — I1310 Hypertensive heart and chronic kidney disease without heart failure, with stage 1 through stage 4 chronic kidney disease, or unspecified chronic kidney disease: Secondary | ICD-10-CM | POA: Diagnosis not present

## 2019-01-28 DIAGNOSIS — F17200 Nicotine dependence, unspecified, uncomplicated: Secondary | ICD-10-CM | POA: Diagnosis not present

## 2019-01-30 DIAGNOSIS — I1310 Hypertensive heart and chronic kidney disease without heart failure, with stage 1 through stage 4 chronic kidney disease, or unspecified chronic kidney disease: Secondary | ICD-10-CM | POA: Diagnosis not present

## 2019-01-30 DIAGNOSIS — Z139 Encounter for screening, unspecified: Secondary | ICD-10-CM | POA: Diagnosis not present

## 2019-01-30 DIAGNOSIS — I150 Renovascular hypertension: Secondary | ICD-10-CM | POA: Diagnosis not present

## 2019-01-30 DIAGNOSIS — Z1331 Encounter for screening for depression: Secondary | ICD-10-CM | POA: Diagnosis not present

## 2019-01-30 DIAGNOSIS — Z0000 Encounter for general adult medical examination without abnormal findings: Secondary | ICD-10-CM | POA: Diagnosis not present

## 2019-01-31 DIAGNOSIS — I150 Renovascular hypertension: Secondary | ICD-10-CM | POA: Diagnosis not present

## 2019-01-31 DIAGNOSIS — I701 Atherosclerosis of renal artery: Secondary | ICD-10-CM | POA: Diagnosis not present

## 2019-01-31 DIAGNOSIS — I739 Peripheral vascular disease, unspecified: Secondary | ICD-10-CM | POA: Diagnosis not present

## 2019-01-31 DIAGNOSIS — I1310 Hypertensive heart and chronic kidney disease without heart failure, with stage 1 through stage 4 chronic kidney disease, or unspecified chronic kidney disease: Secondary | ICD-10-CM | POA: Diagnosis not present

## 2019-03-01 DIAGNOSIS — I150 Renovascular hypertension: Secondary | ICD-10-CM | POA: Diagnosis not present

## 2019-03-01 DIAGNOSIS — I1310 Hypertensive heart and chronic kidney disease without heart failure, with stage 1 through stage 4 chronic kidney disease, or unspecified chronic kidney disease: Secondary | ICD-10-CM | POA: Diagnosis not present

## 2019-03-01 DIAGNOSIS — I701 Atherosclerosis of renal artery: Secondary | ICD-10-CM | POA: Diagnosis not present

## 2019-03-01 DIAGNOSIS — I739 Peripheral vascular disease, unspecified: Secondary | ICD-10-CM | POA: Diagnosis not present

## 2019-03-03 DIAGNOSIS — I1310 Hypertensive heart and chronic kidney disease without heart failure, with stage 1 through stage 4 chronic kidney disease, or unspecified chronic kidney disease: Secondary | ICD-10-CM | POA: Diagnosis not present

## 2019-03-03 DIAGNOSIS — I150 Renovascular hypertension: Secondary | ICD-10-CM | POA: Diagnosis not present

## 2019-03-29 DIAGNOSIS — I10 Essential (primary) hypertension: Secondary | ICD-10-CM | POA: Diagnosis not present

## 2019-03-29 DIAGNOSIS — E119 Type 2 diabetes mellitus without complications: Secondary | ICD-10-CM | POA: Diagnosis not present

## 2019-03-29 DIAGNOSIS — Z87891 Personal history of nicotine dependence: Secondary | ICD-10-CM | POA: Diagnosis not present

## 2019-03-29 DIAGNOSIS — Z720 Tobacco use: Secondary | ICD-10-CM | POA: Diagnosis not present

## 2019-03-29 DIAGNOSIS — I739 Peripheral vascular disease, unspecified: Secondary | ICD-10-CM | POA: Diagnosis not present

## 2019-03-29 DIAGNOSIS — E7800 Pure hypercholesterolemia, unspecified: Secondary | ICD-10-CM | POA: Diagnosis not present

## 2019-04-02 DIAGNOSIS — I701 Atherosclerosis of renal artery: Secondary | ICD-10-CM | POA: Diagnosis not present

## 2019-04-02 DIAGNOSIS — I1310 Hypertensive heart and chronic kidney disease without heart failure, with stage 1 through stage 4 chronic kidney disease, or unspecified chronic kidney disease: Secondary | ICD-10-CM | POA: Diagnosis not present

## 2019-04-02 DIAGNOSIS — I739 Peripheral vascular disease, unspecified: Secondary | ICD-10-CM | POA: Diagnosis not present

## 2019-04-02 DIAGNOSIS — I150 Renovascular hypertension: Secondary | ICD-10-CM | POA: Diagnosis not present

## 2019-04-03 DIAGNOSIS — E875 Hyperkalemia: Secondary | ICD-10-CM | POA: Diagnosis not present

## 2019-05-21 DIAGNOSIS — Z720 Tobacco use: Secondary | ICD-10-CM | POA: Diagnosis not present

## 2019-05-21 DIAGNOSIS — R809 Proteinuria, unspecified: Secondary | ICD-10-CM | POA: Diagnosis not present

## 2019-05-21 DIAGNOSIS — Z0000 Encounter for general adult medical examination without abnormal findings: Secondary | ICD-10-CM | POA: Diagnosis not present

## 2019-05-21 DIAGNOSIS — I701 Atherosclerosis of renal artery: Secondary | ICD-10-CM | POA: Diagnosis not present

## 2019-05-21 DIAGNOSIS — N183 Chronic kidney disease, stage 3 (moderate): Secondary | ICD-10-CM | POA: Diagnosis not present

## 2019-05-21 DIAGNOSIS — N281 Cyst of kidney, acquired: Secondary | ICD-10-CM | POA: Diagnosis not present

## 2019-05-21 DIAGNOSIS — I129 Hypertensive chronic kidney disease with stage 1 through stage 4 chronic kidney disease, or unspecified chronic kidney disease: Secondary | ICD-10-CM | POA: Diagnosis not present

## 2019-05-21 DIAGNOSIS — E1122 Type 2 diabetes mellitus with diabetic chronic kidney disease: Secondary | ICD-10-CM | POA: Diagnosis not present

## 2019-06-11 DIAGNOSIS — N281 Cyst of kidney, acquired: Secondary | ICD-10-CM | POA: Diagnosis not present

## 2019-06-19 DIAGNOSIS — R809 Proteinuria, unspecified: Secondary | ICD-10-CM | POA: Diagnosis not present

## 2019-06-19 DIAGNOSIS — Z720 Tobacco use: Secondary | ICD-10-CM | POA: Diagnosis not present

## 2019-06-19 DIAGNOSIS — E871 Hypo-osmolality and hyponatremia: Secondary | ICD-10-CM | POA: Diagnosis not present

## 2019-06-19 DIAGNOSIS — I129 Hypertensive chronic kidney disease with stage 1 through stage 4 chronic kidney disease, or unspecified chronic kidney disease: Secondary | ICD-10-CM | POA: Diagnosis not present

## 2019-06-19 DIAGNOSIS — I701 Atherosclerosis of renal artery: Secondary | ICD-10-CM | POA: Diagnosis not present

## 2019-06-19 DIAGNOSIS — N281 Cyst of kidney, acquired: Secondary | ICD-10-CM | POA: Diagnosis not present

## 2019-06-19 DIAGNOSIS — N183 Chronic kidney disease, stage 3 (moderate): Secondary | ICD-10-CM | POA: Diagnosis not present

## 2019-06-28 DIAGNOSIS — N183 Chronic kidney disease, stage 3 (moderate): Secondary | ICD-10-CM | POA: Diagnosis not present

## 2019-06-28 DIAGNOSIS — E119 Type 2 diabetes mellitus without complications: Secondary | ICD-10-CM | POA: Diagnosis not present

## 2019-06-28 DIAGNOSIS — I10 Essential (primary) hypertension: Secondary | ICD-10-CM | POA: Diagnosis not present

## 2019-06-28 DIAGNOSIS — E7800 Pure hypercholesterolemia, unspecified: Secondary | ICD-10-CM | POA: Diagnosis not present

## 2019-07-22 DIAGNOSIS — F419 Anxiety disorder, unspecified: Secondary | ICD-10-CM | POA: Diagnosis not present

## 2019-07-23 DIAGNOSIS — I6523 Occlusion and stenosis of bilateral carotid arteries: Secondary | ICD-10-CM | POA: Diagnosis not present

## 2019-07-23 DIAGNOSIS — I739 Peripheral vascular disease, unspecified: Secondary | ICD-10-CM | POA: Diagnosis not present

## 2019-07-23 DIAGNOSIS — I701 Atherosclerosis of renal artery: Secondary | ICD-10-CM | POA: Diagnosis not present

## 2019-10-15 DIAGNOSIS — E871 Hypo-osmolality and hyponatremia: Secondary | ICD-10-CM | POA: Diagnosis not present

## 2019-10-15 DIAGNOSIS — R809 Proteinuria, unspecified: Secondary | ICD-10-CM | POA: Diagnosis not present

## 2019-10-15 DIAGNOSIS — Z720 Tobacco use: Secondary | ICD-10-CM | POA: Diagnosis not present

## 2019-10-15 DIAGNOSIS — I701 Atherosclerosis of renal artery: Secondary | ICD-10-CM | POA: Diagnosis not present

## 2019-10-15 DIAGNOSIS — I129 Hypertensive chronic kidney disease with stage 1 through stage 4 chronic kidney disease, or unspecified chronic kidney disease: Secondary | ICD-10-CM | POA: Diagnosis not present

## 2019-10-15 DIAGNOSIS — N281 Cyst of kidney, acquired: Secondary | ICD-10-CM | POA: Diagnosis not present

## 2019-10-15 DIAGNOSIS — N183 Chronic kidney disease, stage 3 unspecified: Secondary | ICD-10-CM | POA: Diagnosis not present

## 2019-10-23 DIAGNOSIS — F419 Anxiety disorder, unspecified: Secondary | ICD-10-CM | POA: Diagnosis not present

## 2019-12-12 DIAGNOSIS — E113291 Type 2 diabetes mellitus with mild nonproliferative diabetic retinopathy without macular edema, right eye: Secondary | ICD-10-CM | POA: Diagnosis not present

## 2019-12-12 DIAGNOSIS — H25813 Combined forms of age-related cataract, bilateral: Secondary | ICD-10-CM | POA: Diagnosis not present

## 2020-01-13 DIAGNOSIS — R21 Rash and other nonspecific skin eruption: Secondary | ICD-10-CM | POA: Diagnosis not present

## 2020-01-13 DIAGNOSIS — E7800 Pure hypercholesterolemia, unspecified: Secondary | ICD-10-CM | POA: Diagnosis not present

## 2020-01-13 DIAGNOSIS — I10 Essential (primary) hypertension: Secondary | ICD-10-CM | POA: Diagnosis not present

## 2020-01-20 DIAGNOSIS — E875 Hyperkalemia: Secondary | ICD-10-CM | POA: Diagnosis not present

## 2020-02-04 DIAGNOSIS — Z7982 Long term (current) use of aspirin: Secondary | ICD-10-CM | POA: Diagnosis not present

## 2020-02-04 DIAGNOSIS — T82858A Stenosis of vascular prosthetic devices, implants and grafts, initial encounter: Secondary | ICD-10-CM | POA: Diagnosis not present

## 2020-02-04 DIAGNOSIS — I701 Atherosclerosis of renal artery: Secondary | ICD-10-CM | POA: Diagnosis not present

## 2020-02-04 DIAGNOSIS — I6523 Occlusion and stenosis of bilateral carotid arteries: Secondary | ICD-10-CM | POA: Diagnosis not present

## 2020-02-04 DIAGNOSIS — Z95820 Peripheral vascular angioplasty status with implants and grafts: Secondary | ICD-10-CM | POA: Diagnosis not present

## 2020-02-05 DIAGNOSIS — L928 Other granulomatous disorders of the skin and subcutaneous tissue: Secondary | ICD-10-CM | POA: Diagnosis not present

## 2020-02-05 DIAGNOSIS — L958 Other vasculitis limited to the skin: Secondary | ICD-10-CM | POA: Diagnosis not present

## 2020-02-05 DIAGNOSIS — D485 Neoplasm of uncertain behavior of skin: Secondary | ICD-10-CM | POA: Diagnosis not present

## 2020-02-11 DIAGNOSIS — E871 Hypo-osmolality and hyponatremia: Secondary | ICD-10-CM | POA: Diagnosis not present

## 2020-02-11 DIAGNOSIS — R809 Proteinuria, unspecified: Secondary | ICD-10-CM | POA: Diagnosis not present

## 2020-02-11 DIAGNOSIS — I701 Atherosclerosis of renal artery: Secondary | ICD-10-CM | POA: Diagnosis not present

## 2020-02-11 DIAGNOSIS — N183 Chronic kidney disease, stage 3 unspecified: Secondary | ICD-10-CM | POA: Diagnosis not present

## 2020-02-11 DIAGNOSIS — I129 Hypertensive chronic kidney disease with stage 1 through stage 4 chronic kidney disease, or unspecified chronic kidney disease: Secondary | ICD-10-CM | POA: Diagnosis not present

## 2020-02-11 DIAGNOSIS — Z720 Tobacco use: Secondary | ICD-10-CM | POA: Diagnosis not present

## 2020-02-11 DIAGNOSIS — N281 Cyst of kidney, acquired: Secondary | ICD-10-CM | POA: Diagnosis not present

## 2020-04-13 DIAGNOSIS — Z789 Other specified health status: Secondary | ICD-10-CM | POA: Diagnosis not present

## 2020-04-13 DIAGNOSIS — I10 Essential (primary) hypertension: Secondary | ICD-10-CM | POA: Diagnosis not present

## 2020-04-13 DIAGNOSIS — N1832 Chronic kidney disease, stage 3b: Secondary | ICD-10-CM | POA: Diagnosis not present

## 2020-04-13 DIAGNOSIS — E7800 Pure hypercholesterolemia, unspecified: Secondary | ICD-10-CM | POA: Diagnosis not present

## 2020-04-13 DIAGNOSIS — I739 Peripheral vascular disease, unspecified: Secondary | ICD-10-CM | POA: Diagnosis not present

## 2020-04-20 DIAGNOSIS — E875 Hyperkalemia: Secondary | ICD-10-CM | POA: Diagnosis not present

## 2020-05-06 DIAGNOSIS — E875 Hyperkalemia: Secondary | ICD-10-CM | POA: Diagnosis not present

## 2020-06-15 DIAGNOSIS — I701 Atherosclerosis of renal artery: Secondary | ICD-10-CM | POA: Diagnosis not present

## 2020-06-15 DIAGNOSIS — E871 Hypo-osmolality and hyponatremia: Secondary | ICD-10-CM | POA: Diagnosis not present

## 2020-06-15 DIAGNOSIS — N183 Chronic kidney disease, stage 3 unspecified: Secondary | ICD-10-CM | POA: Diagnosis not present

## 2020-06-15 DIAGNOSIS — I129 Hypertensive chronic kidney disease with stage 1 through stage 4 chronic kidney disease, or unspecified chronic kidney disease: Secondary | ICD-10-CM | POA: Diagnosis not present

## 2020-06-15 DIAGNOSIS — R809 Proteinuria, unspecified: Secondary | ICD-10-CM | POA: Diagnosis not present

## 2020-06-15 DIAGNOSIS — N281 Cyst of kidney, acquired: Secondary | ICD-10-CM | POA: Diagnosis not present

## 2020-06-15 DIAGNOSIS — Z720 Tobacco use: Secondary | ICD-10-CM | POA: Diagnosis not present

## 2020-06-19 ENCOUNTER — Other Ambulatory Visit: Payer: Self-pay | Admitting: Internal Medicine

## 2020-06-19 DIAGNOSIS — N281 Cyst of kidney, acquired: Secondary | ICD-10-CM

## 2020-06-19 DIAGNOSIS — R809 Proteinuria, unspecified: Secondary | ICD-10-CM

## 2020-06-19 DIAGNOSIS — N183 Chronic kidney disease, stage 3 unspecified: Secondary | ICD-10-CM

## 2020-06-29 DIAGNOSIS — N189 Chronic kidney disease, unspecified: Secondary | ICD-10-CM | POA: Diagnosis not present

## 2020-06-29 DIAGNOSIS — N183 Chronic kidney disease, stage 3 unspecified: Secondary | ICD-10-CM | POA: Diagnosis not present

## 2020-06-29 DIAGNOSIS — N281 Cyst of kidney, acquired: Secondary | ICD-10-CM | POA: Diagnosis not present

## 2020-07-17 DIAGNOSIS — I10 Essential (primary) hypertension: Secondary | ICD-10-CM | POA: Diagnosis not present

## 2020-08-07 DIAGNOSIS — Z720 Tobacco use: Secondary | ICD-10-CM | POA: Diagnosis not present

## 2020-08-07 DIAGNOSIS — I701 Atherosclerosis of renal artery: Secondary | ICD-10-CM | POA: Diagnosis not present

## 2020-08-07 DIAGNOSIS — Z95828 Presence of other vascular implants and grafts: Secondary | ICD-10-CM | POA: Diagnosis not present

## 2020-08-07 DIAGNOSIS — I6523 Occlusion and stenosis of bilateral carotid arteries: Secondary | ICD-10-CM | POA: Diagnosis not present

## 2020-08-11 DIAGNOSIS — I6523 Occlusion and stenosis of bilateral carotid arteries: Secondary | ICD-10-CM | POA: Diagnosis not present

## 2020-09-30 DIAGNOSIS — F411 Generalized anxiety disorder: Secondary | ICD-10-CM | POA: Diagnosis not present

## 2020-10-14 DIAGNOSIS — E872 Acidosis: Secondary | ICD-10-CM | POA: Diagnosis not present

## 2020-10-14 DIAGNOSIS — Z720 Tobacco use: Secondary | ICD-10-CM | POA: Diagnosis not present

## 2020-10-14 DIAGNOSIS — R809 Proteinuria, unspecified: Secondary | ICD-10-CM | POA: Diagnosis not present

## 2020-10-14 DIAGNOSIS — N183 Chronic kidney disease, stage 3 unspecified: Secondary | ICD-10-CM | POA: Diagnosis not present

## 2020-10-14 DIAGNOSIS — I129 Hypertensive chronic kidney disease with stage 1 through stage 4 chronic kidney disease, or unspecified chronic kidney disease: Secondary | ICD-10-CM | POA: Diagnosis not present

## 2020-10-14 DIAGNOSIS — E871 Hypo-osmolality and hyponatremia: Secondary | ICD-10-CM | POA: Diagnosis not present

## 2020-10-14 DIAGNOSIS — I701 Atherosclerosis of renal artery: Secondary | ICD-10-CM | POA: Diagnosis not present

## 2020-10-14 DIAGNOSIS — N281 Cyst of kidney, acquired: Secondary | ICD-10-CM | POA: Diagnosis not present

## 2020-10-14 DIAGNOSIS — R300 Dysuria: Secondary | ICD-10-CM | POA: Diagnosis not present

## 2020-10-30 DIAGNOSIS — M25511 Pain in right shoulder: Secondary | ICD-10-CM | POA: Diagnosis not present

## 2020-10-30 DIAGNOSIS — M25512 Pain in left shoulder: Secondary | ICD-10-CM | POA: Diagnosis not present

## 2020-10-30 DIAGNOSIS — I10 Essential (primary) hypertension: Secondary | ICD-10-CM | POA: Diagnosis not present

## 2021-01-05 DIAGNOSIS — Z1331 Encounter for screening for depression: Secondary | ICD-10-CM | POA: Diagnosis not present

## 2021-01-05 DIAGNOSIS — Z87891 Personal history of nicotine dependence: Secondary | ICD-10-CM | POA: Diagnosis not present

## 2021-01-05 DIAGNOSIS — E1121 Type 2 diabetes mellitus with diabetic nephropathy: Secondary | ICD-10-CM | POA: Diagnosis not present

## 2021-01-05 DIAGNOSIS — R011 Cardiac murmur, unspecified: Secondary | ICD-10-CM | POA: Diagnosis not present

## 2021-01-05 DIAGNOSIS — I10 Essential (primary) hypertension: Secondary | ICD-10-CM | POA: Diagnosis not present

## 2021-01-05 DIAGNOSIS — J449 Chronic obstructive pulmonary disease, unspecified: Secondary | ICD-10-CM | POA: Diagnosis not present

## 2021-01-05 DIAGNOSIS — I701 Atherosclerosis of renal artery: Secondary | ICD-10-CM | POA: Diagnosis not present

## 2021-01-05 DIAGNOSIS — E1159 Type 2 diabetes mellitus with other circulatory complications: Secondary | ICD-10-CM | POA: Diagnosis not present

## 2021-01-05 DIAGNOSIS — Z0000 Encounter for general adult medical examination without abnormal findings: Secondary | ICD-10-CM | POA: Diagnosis not present

## 2021-01-05 DIAGNOSIS — E7800 Pure hypercholesterolemia, unspecified: Secondary | ICD-10-CM | POA: Diagnosis not present

## 2021-01-05 DIAGNOSIS — N1832 Chronic kidney disease, stage 3b: Secondary | ICD-10-CM | POA: Diagnosis not present

## 2021-01-05 DIAGNOSIS — R69 Illness, unspecified: Secondary | ICD-10-CM | POA: Diagnosis not present

## 2021-01-05 DIAGNOSIS — Z1339 Encounter for screening examination for other mental health and behavioral disorders: Secondary | ICD-10-CM | POA: Diagnosis not present

## 2021-01-12 DIAGNOSIS — R011 Cardiac murmur, unspecified: Secondary | ICD-10-CM | POA: Diagnosis not present

## 2021-02-09 DIAGNOSIS — I6523 Occlusion and stenosis of bilateral carotid arteries: Secondary | ICD-10-CM | POA: Diagnosis not present

## 2021-02-09 DIAGNOSIS — R69 Illness, unspecified: Secondary | ICD-10-CM | POA: Diagnosis not present

## 2021-02-09 DIAGNOSIS — I701 Atherosclerosis of renal artery: Secondary | ICD-10-CM | POA: Diagnosis not present

## 2021-02-10 DIAGNOSIS — N281 Cyst of kidney, acquired: Secondary | ICD-10-CM | POA: Diagnosis not present

## 2021-02-10 DIAGNOSIS — I129 Hypertensive chronic kidney disease with stage 1 through stage 4 chronic kidney disease, or unspecified chronic kidney disease: Secondary | ICD-10-CM | POA: Diagnosis not present

## 2021-02-10 DIAGNOSIS — I701 Atherosclerosis of renal artery: Secondary | ICD-10-CM | POA: Diagnosis not present

## 2021-02-10 DIAGNOSIS — E871 Hypo-osmolality and hyponatremia: Secondary | ICD-10-CM | POA: Diagnosis not present

## 2021-02-10 DIAGNOSIS — E872 Acidosis: Secondary | ICD-10-CM | POA: Diagnosis not present

## 2021-02-10 DIAGNOSIS — Z720 Tobacco use: Secondary | ICD-10-CM | POA: Diagnosis not present

## 2021-02-10 DIAGNOSIS — R809 Proteinuria, unspecified: Secondary | ICD-10-CM | POA: Diagnosis not present

## 2021-02-10 DIAGNOSIS — N183 Chronic kidney disease, stage 3 unspecified: Secondary | ICD-10-CM | POA: Diagnosis not present

## 2021-02-16 DIAGNOSIS — N189 Chronic kidney disease, unspecified: Secondary | ICD-10-CM | POA: Diagnosis not present

## 2021-02-16 DIAGNOSIS — N183 Chronic kidney disease, stage 3 unspecified: Secondary | ICD-10-CM | POA: Diagnosis not present

## 2021-02-24 DIAGNOSIS — N183 Chronic kidney disease, stage 3 unspecified: Secondary | ICD-10-CM | POA: Diagnosis not present

## 2021-03-03 DIAGNOSIS — E119 Type 2 diabetes mellitus without complications: Secondary | ICD-10-CM | POA: Diagnosis not present

## 2021-04-01 DIAGNOSIS — R69 Illness, unspecified: Secondary | ICD-10-CM | POA: Diagnosis not present

## 2021-04-09 DIAGNOSIS — E7800 Pure hypercholesterolemia, unspecified: Secondary | ICD-10-CM | POA: Diagnosis not present

## 2021-04-09 DIAGNOSIS — N1832 Chronic kidney disease, stage 3b: Secondary | ICD-10-CM | POA: Diagnosis not present

## 2021-04-09 DIAGNOSIS — I739 Peripheral vascular disease, unspecified: Secondary | ICD-10-CM | POA: Diagnosis not present

## 2021-04-09 DIAGNOSIS — E119 Type 2 diabetes mellitus without complications: Secondary | ICD-10-CM | POA: Diagnosis not present

## 2021-06-15 DIAGNOSIS — L720 Epidermal cyst: Secondary | ICD-10-CM | POA: Diagnosis not present

## 2021-06-15 DIAGNOSIS — L920 Granuloma annulare: Secondary | ICD-10-CM | POA: Diagnosis not present

## 2021-06-30 DIAGNOSIS — L720 Epidermal cyst: Secondary | ICD-10-CM | POA: Diagnosis not present

## 2021-07-02 DIAGNOSIS — E7800 Pure hypercholesterolemia, unspecified: Secondary | ICD-10-CM | POA: Diagnosis not present

## 2021-07-02 DIAGNOSIS — I10 Essential (primary) hypertension: Secondary | ICD-10-CM | POA: Diagnosis not present

## 2021-07-12 DIAGNOSIS — N1832 Chronic kidney disease, stage 3b: Secondary | ICD-10-CM | POA: Diagnosis not present

## 2021-07-12 DIAGNOSIS — E7800 Pure hypercholesterolemia, unspecified: Secondary | ICD-10-CM | POA: Diagnosis not present

## 2021-07-12 DIAGNOSIS — E1151 Type 2 diabetes mellitus with diabetic peripheral angiopathy without gangrene: Secondary | ICD-10-CM | POA: Diagnosis not present

## 2021-07-12 DIAGNOSIS — I10 Essential (primary) hypertension: Secondary | ICD-10-CM | POA: Diagnosis not present

## 2021-07-13 DIAGNOSIS — E1151 Type 2 diabetes mellitus with diabetic peripheral angiopathy without gangrene: Secondary | ICD-10-CM | POA: Diagnosis not present

## 2021-07-13 DIAGNOSIS — E7800 Pure hypercholesterolemia, unspecified: Secondary | ICD-10-CM | POA: Diagnosis not present

## 2021-07-23 DIAGNOSIS — R110 Nausea: Secondary | ICD-10-CM | POA: Diagnosis not present

## 2021-09-08 DIAGNOSIS — I129 Hypertensive chronic kidney disease with stage 1 through stage 4 chronic kidney disease, or unspecified chronic kidney disease: Secondary | ICD-10-CM | POA: Diagnosis not present

## 2021-09-08 DIAGNOSIS — E872 Acidosis, unspecified: Secondary | ICD-10-CM | POA: Diagnosis not present

## 2021-09-08 DIAGNOSIS — N183 Chronic kidney disease, stage 3 unspecified: Secondary | ICD-10-CM | POA: Diagnosis not present

## 2021-09-08 DIAGNOSIS — R809 Proteinuria, unspecified: Secondary | ICD-10-CM | POA: Diagnosis not present

## 2021-09-08 DIAGNOSIS — N281 Cyst of kidney, acquired: Secondary | ICD-10-CM | POA: Diagnosis not present

## 2021-09-08 DIAGNOSIS — Z720 Tobacco use: Secondary | ICD-10-CM | POA: Diagnosis not present

## 2021-09-08 DIAGNOSIS — E871 Hypo-osmolality and hyponatremia: Secondary | ICD-10-CM | POA: Diagnosis not present

## 2021-09-08 DIAGNOSIS — I701 Atherosclerosis of renal artery: Secondary | ICD-10-CM | POA: Diagnosis not present

## 2021-09-22 DIAGNOSIS — N183 Chronic kidney disease, stage 3 unspecified: Secondary | ICD-10-CM | POA: Diagnosis not present

## 2021-09-30 DIAGNOSIS — R69 Illness, unspecified: Secondary | ICD-10-CM | POA: Diagnosis not present

## 2021-09-30 DIAGNOSIS — F4310 Post-traumatic stress disorder, unspecified: Secondary | ICD-10-CM | POA: Diagnosis not present

## 2021-10-13 DIAGNOSIS — I701 Atherosclerosis of renal artery: Secondary | ICD-10-CM | POA: Diagnosis not present

## 2021-10-13 DIAGNOSIS — I10 Essential (primary) hypertension: Secondary | ICD-10-CM | POA: Diagnosis not present

## 2022-01-12 DIAGNOSIS — Z0000 Encounter for general adult medical examination without abnormal findings: Secondary | ICD-10-CM | POA: Diagnosis not present

## 2022-01-12 DIAGNOSIS — I10 Essential (primary) hypertension: Secondary | ICD-10-CM | POA: Diagnosis not present

## 2022-01-12 DIAGNOSIS — Z87891 Personal history of nicotine dependence: Secondary | ICD-10-CM | POA: Diagnosis not present

## 2022-01-12 DIAGNOSIS — E1121 Type 2 diabetes mellitus with diabetic nephropathy: Secondary | ICD-10-CM | POA: Diagnosis not present

## 2022-01-12 DIAGNOSIS — Z720 Tobacco use: Secondary | ICD-10-CM | POA: Diagnosis not present

## 2022-01-12 DIAGNOSIS — E7800 Pure hypercholesterolemia, unspecified: Secondary | ICD-10-CM | POA: Diagnosis not present

## 2022-01-12 DIAGNOSIS — Z6824 Body mass index (BMI) 24.0-24.9, adult: Secondary | ICD-10-CM | POA: Diagnosis not present

## 2022-01-12 DIAGNOSIS — I739 Peripheral vascular disease, unspecified: Secondary | ICD-10-CM | POA: Diagnosis not present

## 2022-01-12 DIAGNOSIS — N1832 Chronic kidney disease, stage 3b: Secondary | ICD-10-CM | POA: Diagnosis not present

## 2022-01-12 DIAGNOSIS — I701 Atherosclerosis of renal artery: Secondary | ICD-10-CM | POA: Diagnosis not present

## 2022-01-26 DIAGNOSIS — I701 Atherosclerosis of renal artery: Secondary | ICD-10-CM | POA: Diagnosis not present

## 2022-01-26 DIAGNOSIS — R809 Proteinuria, unspecified: Secondary | ICD-10-CM | POA: Diagnosis not present

## 2022-01-26 DIAGNOSIS — Z720 Tobacco use: Secondary | ICD-10-CM | POA: Diagnosis not present

## 2022-01-26 DIAGNOSIS — I129 Hypertensive chronic kidney disease with stage 1 through stage 4 chronic kidney disease, or unspecified chronic kidney disease: Secondary | ICD-10-CM | POA: Diagnosis not present

## 2022-01-26 DIAGNOSIS — N184 Chronic kidney disease, stage 4 (severe): Secondary | ICD-10-CM | POA: Diagnosis not present

## 2022-01-26 DIAGNOSIS — E872 Acidosis, unspecified: Secondary | ICD-10-CM | POA: Diagnosis not present

## 2022-01-26 DIAGNOSIS — N281 Cyst of kidney, acquired: Secondary | ICD-10-CM | POA: Diagnosis not present

## 2022-01-26 DIAGNOSIS — E871 Hypo-osmolality and hyponatremia: Secondary | ICD-10-CM | POA: Diagnosis not present

## 2022-04-26 DIAGNOSIS — I10 Essential (primary) hypertension: Secondary | ICD-10-CM | POA: Diagnosis not present

## 2022-04-26 DIAGNOSIS — E1121 Type 2 diabetes mellitus with diabetic nephropathy: Secondary | ICD-10-CM | POA: Diagnosis not present

## 2022-04-26 DIAGNOSIS — N1832 Chronic kidney disease, stage 3b: Secondary | ICD-10-CM | POA: Diagnosis not present

## 2022-04-26 DIAGNOSIS — J40 Bronchitis, not specified as acute or chronic: Secondary | ICD-10-CM | POA: Diagnosis not present

## 2022-05-25 DIAGNOSIS — E872 Acidosis, unspecified: Secondary | ICD-10-CM | POA: Diagnosis not present

## 2022-05-25 DIAGNOSIS — R809 Proteinuria, unspecified: Secondary | ICD-10-CM | POA: Diagnosis not present

## 2022-05-25 DIAGNOSIS — E871 Hypo-osmolality and hyponatremia: Secondary | ICD-10-CM | POA: Diagnosis not present

## 2022-05-25 DIAGNOSIS — I701 Atherosclerosis of renal artery: Secondary | ICD-10-CM | POA: Diagnosis not present

## 2022-05-25 DIAGNOSIS — I129 Hypertensive chronic kidney disease with stage 1 through stage 4 chronic kidney disease, or unspecified chronic kidney disease: Secondary | ICD-10-CM | POA: Diagnosis not present

## 2022-05-25 DIAGNOSIS — Z720 Tobacco use: Secondary | ICD-10-CM | POA: Diagnosis not present

## 2022-05-25 DIAGNOSIS — N281 Cyst of kidney, acquired: Secondary | ICD-10-CM | POA: Diagnosis not present

## 2022-05-25 DIAGNOSIS — N184 Chronic kidney disease, stage 4 (severe): Secondary | ICD-10-CM | POA: Diagnosis not present

## 2022-05-25 DIAGNOSIS — R300 Dysuria: Secondary | ICD-10-CM | POA: Diagnosis not present

## 2022-07-14 DIAGNOSIS — H524 Presbyopia: Secondary | ICD-10-CM | POA: Diagnosis not present

## 2022-07-14 DIAGNOSIS — H25813 Combined forms of age-related cataract, bilateral: Secondary | ICD-10-CM | POA: Diagnosis not present

## 2022-07-14 DIAGNOSIS — H35032 Hypertensive retinopathy, left eye: Secondary | ICD-10-CM | POA: Diagnosis not present

## 2022-07-27 DIAGNOSIS — N184 Chronic kidney disease, stage 4 (severe): Secondary | ICD-10-CM | POA: Diagnosis not present

## 2022-07-27 DIAGNOSIS — I10 Essential (primary) hypertension: Secondary | ICD-10-CM | POA: Diagnosis not present

## 2022-07-27 DIAGNOSIS — E119 Type 2 diabetes mellitus without complications: Secondary | ICD-10-CM | POA: Diagnosis not present

## 2022-07-27 DIAGNOSIS — R634 Abnormal weight loss: Secondary | ICD-10-CM | POA: Diagnosis not present

## 2022-07-27 DIAGNOSIS — J309 Allergic rhinitis, unspecified: Secondary | ICD-10-CM | POA: Diagnosis not present

## 2022-08-08 DIAGNOSIS — Z0100 Encounter for examination of eyes and vision without abnormal findings: Secondary | ICD-10-CM | POA: Diagnosis not present

## 2022-08-29 ENCOUNTER — Ambulatory Visit: Payer: Medicare HMO | Admitting: Internal Medicine

## 2022-09-09 ENCOUNTER — Ambulatory Visit (INDEPENDENT_AMBULATORY_CARE_PROVIDER_SITE_OTHER): Payer: Medicare HMO | Admitting: Internal Medicine

## 2022-09-09 ENCOUNTER — Encounter: Payer: Self-pay | Admitting: Internal Medicine

## 2022-09-09 VITALS — BP 220/98 | HR 72 | Temp 97.3°F | Resp 16 | Ht 62.5 in | Wt 116.0 lb

## 2022-09-09 DIAGNOSIS — N184 Chronic kidney disease, stage 4 (severe): Secondary | ICD-10-CM | POA: Diagnosis not present

## 2022-09-09 DIAGNOSIS — K130 Diseases of lips: Secondary | ICD-10-CM

## 2022-09-09 DIAGNOSIS — R682 Dry mouth, unspecified: Secondary | ICD-10-CM | POA: Diagnosis not present

## 2022-09-09 NOTE — Progress Notes (Signed)
Office Visit  Subjective   Patient ID: Lindsey [name]   DOB: 07/13/1946   Age: 76 y.o.   MRN: [id number]   Chief Complaint Chief Complaint  Patient presents with   Follow-up    Cholesterol     History of Present Illness The patient is a 76 year old female who returns today with compalints of continued lip pain where she mentioned on her last visit in 07/27/2022  she thought she had burned her mouth.  She denies any tongue or mouth pain but just pain of both her lips.  She states she is on a low potassium diet due to her Stage IV CKD and she has lost more weight where she lost 5 lbs since her last visit.  She states her appeite is good.  She is allergic to 40-50 things and she thinks she has had an allergic reaction for the last 5 months involving her lips.  However, there has been no redness of her lips and no swelling.  She mouth remains dry and she has problems with talking.    She denies any lesions or plaques but she does have poor dentition.  She states it is not her teeth that is causing this burning.  She has had some eczema involving her hands and her legs and she scratched an area of her left leg with some bruising.  She denies any dry eyes.  Again, she does have a history of Stage IIIb CKD.  She has seen Nephrology who notes that her CKD has slowly been progressing and she is now Stage IV CKD.  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  She also has proteinuria which is  probably related to this and not a primary glomerular pathology.   Nephrology has done lab testing and a renal US and they are monitoring her kidney function.  She has known renal artery stenosis and recommends that if she has a progressive decline in her renal function, she recommends renal artery restenting as proposed by Dr. Radene Knee in Vascular surgery which has been discussed in the past.  We have treated her for hyperkalemia in the past with  lokelma.  The patient is a 76 year old female who returns for a follow-up visit for her diabetes. She was diagnosed with diabetes around 2015. Since the last visit, there have been no problems. She remains on no medications; the diabetes is being managed by dietary intervention alone. She is not walking as much as they would like. She specifically denies unexplained abdominal pain, nausea or vomiting and documented hypoglycemia. She checks blood sugars occasionally and they tend to range somewhere between 100 and 200 mg/dl fasting. She came in fasting today in anticipation of lab work.  Her last HgBA1c was done in 07/2022 and it was 6.4%. 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       Past Medical History Past Medical History:  Diagnosis Date   Anxiety    Arthritis    Asthma    CAD (coronary artery disease)    COPD (chronic obstructive pulmonary disease) (HCC)    Diabetes mellitus, type 2 (HCC)    Fibromyalgia    GERD (gastroesophageal reflux disease)    HPV (human papilloma virus) anogenital infection    Hyperlipidemia    Hypertension    Osteoporosis    Prediabetes    PTSD (post-traumatic stress disorder)  Allergies Allergies  Allergen Reactions   Ace Inhibitors    Acyclovir And Related Other (See Comments)    Made fever blister worse   Baclofen Other (See Comments)    Made muscle spasms worse   Beta Adrenergic Blockers    Biaxin [Clarithromycin] Other (See Comments)    Unknown reaction to XL - can take immediate release up to 500 mg    Buspar [Buspirone] Other (See Comments)    nightmares   Calcium Channel Blockers    Claritin [Loratadine] Itching   Codeine Other (See Comments)    Makes pt aggressive    Colestid [Colestipol Hcl] Other (See Comments)    Flushing   Combivent [Ipratropium-Albuterol] Other (See Comments)    Causes throat irritation    Crestor [Rosuvastatin  Calcium] Other (See Comments)    Myalgias OR ANY STATIN   Cymbalta [Duloxetine Hcl] Swelling    Hand swelling   Decongestant-Antihistamine [Triprolidine-Pseudoephedrine] Other (See Comments)    Causes BP to rise   Depo-Medrol [Methylprednisolone] Itching   Efudex [Fluorouracil] Other (See Comments)    Pain and bleeding   Fenofibrate Other (See Comments)    Interfered with meds    Flagyl [Metronidazole] Other (See Comments)    Causes calcium build up in ears - dizziness   Flexeril [Cyclobenzaprine] Other (See Comments)    Salvia increase, facial swelling   Flonase [Fluticasone Propionate] Other (See Comments)    Nose bleeds   Gabapentin Other (See Comments)    Swelling in hands   Hctz [Hydrochlorothiazide] Other (See Comments)    Fainting - BP drops   Hydrocodone Nausea And Vomiting    bp DROPS   Hydroxyzine Other (See Comments)    Makes pt feel drugged, unbalanced   Isosorbide Other (See Comments)    Unknown reaction   Levsin [Hyoscyamine Sulfate] Swelling   Losartan Potassium Other (See Comments)    Caused potassium  build up in blood   Lotrel [Amlodipine Besy-Benazepril Hcl] Other (See Comments)    Unknown reaction   Macrobid [Nitrofurantoin] Other (See Comments)    Hospitalized for severe itching   Meclizine     Effects nervous system   Nasonex [Mometasone Furoate] Other (See Comments)    Nose bleeds   Niaspan [Niacin] Other (See Comments)    Flushing, sweating   Norvasc [Amlodipine Besylate] Other (See Comments)    Headache, constipation,  Flushing, nausea, tiredness. mood changes   Paxil [Paroxetine Hcl] Other (See Comments)    Pt was in "outer space"   Phenergan [Promethazine Hcl] Other (See Comments)    Caused mouth dryness    Premarin [Conjugated Estrogens] Other (See Comments)    CAUSED BLEEDING (cream)   Red Dye Other (See Comments)    Headache, irritable, weakness   Relafen [Nabumetone] Other (See Comments)    Severe headaches   Singulair [Montelukast  Sodium] Other (See Comments)    Unknown reaction   Spiriva [Tiotropium Bromide Monohydrate] Other (See Comments)    Irritates throat, dry mouth   Statins     Muscle aches   Sulfa Antibiotics Swelling    Edema, N&V   Tequin [Gatifloxacin] Other (See Comments)    Unknown reaction (may have burned eyes)   Tramadol Other (See Comments)    Pt states she can not handle med, nausea, constipation, anxiety, dry mouth, mental confusion, tingling in hands and feet   Transderm-Scop [Scopolamine]     Lock jaw   Trazodone And Nefazodone Other (See Comments)    Involuntary muscle movements  Triamcinolone Other (See Comments)    Dried skin, cracked skin   Venlafaxine Other (See Comments)    Possibly caused confusion   Zyrtec [Cetirizine]    Advair Diskus [Fluticasone-Salmeterol] Palpitations   Amoxicillin Palpitations    Has patient had a PCN reaction causing immediate rash, facial/tongue/throat swelling, SOB or lightheadedness with hypotension: yes Has patient had a PCN reaction causing severe rash involving mucus membranes or skin necrosis: no Has patient had a PCN reaction that required hospitalization: no Has patient had a PCN reaction occurring within the last 10 years: yes If all of the above answers are "NO", then may proceed with Cephalosporin use.    Augmentin [Amoxicillin-Pot Clavulanate] Palpitations   Ephedrine Palpitations    BP rises   Latex Rash   Prednisone Palpitations    Pressure in eyes     Review of Systems Review of Systems  Constitutional:  Positive for weight loss. Negative for chills, fever and malaise/fatigue.  HENT:  Negative for congestion, sinus pain and sore throat.   Respiratory:  Negative for cough and shortness of breath.   Cardiovascular:  Negative for chest pain, palpitations and leg swelling.  Gastrointestinal:  Negative for constipation, diarrhea, nausea and vomiting.  Skin:  Positive for itching and rash.  Neurological:  Negative for dizziness,  weakness and headaches.       Objective:    Vitals BP (!) 220/98   Pulse 72   Temp (!) 97.3 F (36.3 C)   Resp 16   Ht 5' 2.5" (1.588 m)   Wt 116 lb 0.4 oz (52.6 kg)   SpO2 98%   BMI 20.88 kg/m    Physical Examination Physical Exam Constitutional:      Appearance: Normal appearance.  HENT:     Nose: Nose normal. No congestion or rhinorrhea.     Mouth/Throat:     Mouth: Mucous membranes are dry.     Pharynx: Oropharynx is clear. No oropharyngeal exudate or posterior oropharyngeal erythema.     Comments: Her tongue looks normal without lesions and she has very poor dentition. Cardiovascular:     Rate and Rhythm: Normal rate and regular rhythm.     Pulses: Normal pulses.     Heart sounds: No murmur heard.    No friction rub. No gallop.  Pulmonary:     Effort: Pulmonary effort is normal. No respiratory distress.     Breath sounds: No wheezing, rhonchi or rales.  Abdominal:     General: Abdomen is flat. Bowel sounds are normal. There is no distension.     Palpations: Abdomen is soft.     Tenderness: There is no abdominal tenderness.  Musculoskeletal:     Right lower leg: No edema.     Left lower leg: No edema.  Skin:    General: Skin is warm and dry.     Findings: Rash present.  Neurological:     Mental Status: She is alert.        Assessment & Plan:   Dry mouth She is not really endorsing dry eyes.  I am going to get Anti-Ro (SS-A) and Anti-La (SS-B) with ANA to evaluate for Sjogrens syndrome.  Lip pain She is complaining of lip pain for months.  I asked her to see dentistry for her poor dentitiion.  I am going to refer her to ENT for evaluation.  Stage 4 chronic kidney disease ([place]) She has Stage IV CKD and we will check her labs today.    No follow-ups on  file.   Townsend Roger, MD

## 2022-09-09 NOTE — Assessment & Plan Note (Signed)
She has Stage IV CKD and we will check her labs today.

## 2022-09-09 NOTE — Assessment & Plan Note (Signed)
She is not really endorsing dry eyes.  I am going to get Anti-Ro (SS-A) and Anti-La (SS-B) with ANA to evaluate for Sjogrens syndrome.

## 2022-09-09 NOTE — Assessment & Plan Note (Signed)
She is complaining of lip pain for months.  I asked her to see dentistry for her poor dentitiion.  I am going to refer her to ENT for evaluation.

## 2022-09-13 LAB — CBC WITH DIFFERENTIAL/PLATELET
Basophils Absolute: 0.1 10*3/uL (ref 0.0–0.2)
Basos: 2 %
EOS (ABSOLUTE): 0.3 10*3/uL (ref 0.0–0.4)
Eos: 5 %
Hematocrit: 33.1 % — ABNORMAL LOW (ref 34.0–46.6)
Hemoglobin: 11.2 g/dL (ref 11.1–15.9)
Immature Grans (Abs): 0 10*3/uL (ref 0.0–0.1)
Immature Granulocytes: 0 %
Lymphocytes Absolute: 1.2 10*3/uL (ref 0.7–3.1)
Lymphs: 16 %
MCH: 27.9 pg (ref 26.6–33.0)
MCHC: 33.8 g/dL (ref 31.5–35.7)
MCV: 83 fL (ref 79–97)
Monocytes Absolute: 0.5 10*3/uL (ref 0.1–0.9)
Monocytes: 7 %
Neutrophils Absolute: 5.3 10*3/uL (ref 1.4–7.0)
Neutrophils: 70 %
Platelets: 377 10*3/uL (ref 150–450)
RBC: 4.01 x10E6/uL (ref 3.77–5.28)
RDW: 14.4 % (ref 11.7–15.4)
WBC: 7.5 10*3/uL (ref 3.4–10.8)

## 2022-09-13 LAB — CMP14 + ANION GAP
ALT: 4 IU/L (ref 0–32)
AST: 10 IU/L (ref 0–40)
Albumin/Globulin Ratio: 1.3 (ref 1.2–2.2)
Albumin: 4 g/dL (ref 3.8–4.8)
Alkaline Phosphatase: 158 IU/L — ABNORMAL HIGH (ref 44–121)
Anion Gap: 21 mmol/L — ABNORMAL HIGH (ref 10.0–18.0)
BUN/Creatinine Ratio: 8 — ABNORMAL LOW (ref 12–28)
BUN: 23 mg/dL (ref 8–27)
Bilirubin Total: 0.5 mg/dL (ref 0.0–1.2)
CO2: 15 mmol/L — ABNORMAL LOW (ref 20–29)
Calcium: 9.2 mg/dL (ref 8.7–10.3)
Chloride: 102 mmol/L (ref 96–106)
Creatinine, Ser: 2.98 mg/dL — ABNORMAL HIGH (ref 0.57–1.00)
Globulin, Total: 3.1 g/dL (ref 1.5–4.5)
Glucose: 93 mg/dL (ref 70–99)
Potassium: 5 mmol/L (ref 3.5–5.2)
Sodium: 138 mmol/L (ref 134–144)
Total Protein: 7.1 g/dL (ref 6.0–8.5)
eGFR: 16 mL/min/{1.73_m2} — ABNORMAL LOW (ref >59)

## 2022-09-13 LAB — ANTINUCLEAR ANTIBODIES, IFA: ANA Titer 1: NEGATIVE

## 2022-09-13 LAB — SJOGREN'S SYNDROME ANTIBODS(SSA + SSB)
ENA SSA (RO) Ab: <0.2 AI (ref 0.0–0.9)
ENA SSB (LA) Ab: <0.2 AI (ref 0.0–0.9)

## 2022-09-29 DIAGNOSIS — F4310 Post-traumatic stress disorder, unspecified: Secondary | ICD-10-CM | POA: Diagnosis not present

## 2022-09-29 DIAGNOSIS — R69 Illness, unspecified: Secondary | ICD-10-CM | POA: Diagnosis not present

## 2022-10-03 DIAGNOSIS — H9203 Otalgia, bilateral: Secondary | ICD-10-CM | POA: Diagnosis not present

## 2022-10-03 DIAGNOSIS — R059 Cough, unspecified: Secondary | ICD-10-CM | POA: Diagnosis not present

## 2022-10-03 DIAGNOSIS — J069 Acute upper respiratory infection, unspecified: Secondary | ICD-10-CM | POA: Diagnosis not present

## 2022-10-03 DIAGNOSIS — J209 Acute bronchitis, unspecified: Secondary | ICD-10-CM | POA: Diagnosis not present

## 2022-10-12 DIAGNOSIS — R001 Bradycardia, unspecified: Secondary | ICD-10-CM | POA: Diagnosis not present

## 2022-10-12 DIAGNOSIS — I10 Essential (primary) hypertension: Secondary | ICD-10-CM | POA: Diagnosis not present

## 2022-10-12 DIAGNOSIS — R9431 Abnormal electrocardiogram [ECG] [EKG]: Secondary | ICD-10-CM | POA: Diagnosis not present

## 2022-10-12 DIAGNOSIS — R634 Abnormal weight loss: Secondary | ICD-10-CM | POA: Diagnosis not present

## 2022-10-12 DIAGNOSIS — J09X2 Influenza due to identified novel influenza A virus with other respiratory manifestations: Secondary | ICD-10-CM | POA: Diagnosis not present

## 2022-10-12 DIAGNOSIS — R059 Cough, unspecified: Secondary | ICD-10-CM | POA: Diagnosis not present

## 2022-10-12 DIAGNOSIS — J189 Pneumonia, unspecified organism: Secondary | ICD-10-CM | POA: Diagnosis not present

## 2022-10-12 DIAGNOSIS — J101 Influenza due to other identified influenza virus with other respiratory manifestations: Secondary | ICD-10-CM | POA: Diagnosis not present

## 2022-10-12 DIAGNOSIS — J449 Chronic obstructive pulmonary disease, unspecified: Secondary | ICD-10-CM | POA: Diagnosis not present

## 2022-10-12 DIAGNOSIS — Z20822 Contact with and (suspected) exposure to covid-19: Secondary | ICD-10-CM | POA: Diagnosis not present

## 2022-10-12 DIAGNOSIS — R519 Headache, unspecified: Secondary | ICD-10-CM | POA: Diagnosis not present

## 2022-10-12 DIAGNOSIS — R0602 Shortness of breath: Secondary | ICD-10-CM | POA: Diagnosis not present

## 2022-10-18 DIAGNOSIS — K8020 Calculus of gallbladder without cholecystitis without obstruction: Secondary | ICD-10-CM | POA: Diagnosis not present

## 2022-10-18 DIAGNOSIS — Z792 Long term (current) use of antibiotics: Secondary | ICD-10-CM | POA: Diagnosis not present

## 2022-10-18 DIAGNOSIS — K5730 Diverticulosis of large intestine without perforation or abscess without bleeding: Secondary | ICD-10-CM | POA: Diagnosis not present

## 2022-10-18 DIAGNOSIS — E871 Hypo-osmolality and hyponatremia: Secondary | ICD-10-CM | POA: Diagnosis not present

## 2022-10-18 DIAGNOSIS — Z79899 Other long term (current) drug therapy: Secondary | ICD-10-CM | POA: Diagnosis not present

## 2022-10-18 DIAGNOSIS — R059 Cough, unspecified: Secondary | ICD-10-CM | POA: Diagnosis not present

## 2022-10-18 DIAGNOSIS — R42 Dizziness and giddiness: Secondary | ICD-10-CM | POA: Diagnosis not present

## 2022-10-18 DIAGNOSIS — M1990 Unspecified osteoarthritis, unspecified site: Secondary | ICD-10-CM | POA: Diagnosis not present

## 2022-10-18 DIAGNOSIS — K219 Gastro-esophageal reflux disease without esophagitis: Secondary | ICD-10-CM | POA: Diagnosis not present

## 2022-10-18 DIAGNOSIS — J439 Emphysema, unspecified: Secondary | ICD-10-CM | POA: Diagnosis not present

## 2022-10-18 DIAGNOSIS — Z91041 Radiographic dye allergy status: Secondary | ICD-10-CM | POA: Diagnosis not present

## 2022-10-18 DIAGNOSIS — Z7982 Long term (current) use of aspirin: Secondary | ICD-10-CM | POA: Diagnosis not present

## 2022-10-18 DIAGNOSIS — J449 Chronic obstructive pulmonary disease, unspecified: Secondary | ICD-10-CM | POA: Diagnosis not present

## 2022-10-18 DIAGNOSIS — Z8589 Personal history of malignant neoplasm of other organs and systems: Secondary | ICD-10-CM | POA: Diagnosis not present

## 2022-10-18 DIAGNOSIS — E1122 Type 2 diabetes mellitus with diabetic chronic kidney disease: Secondary | ICD-10-CM | POA: Diagnosis not present

## 2022-10-18 DIAGNOSIS — I10 Essential (primary) hypertension: Secondary | ICD-10-CM | POA: Diagnosis not present

## 2022-10-18 DIAGNOSIS — D631 Anemia in chronic kidney disease: Secondary | ICD-10-CM | POA: Diagnosis not present

## 2022-10-18 DIAGNOSIS — I2510 Atherosclerotic heart disease of native coronary artery without angina pectoris: Secondary | ICD-10-CM | POA: Diagnosis not present

## 2022-10-18 DIAGNOSIS — N179 Acute kidney failure, unspecified: Secondary | ICD-10-CM | POA: Diagnosis not present

## 2022-10-18 DIAGNOSIS — Z888 Allergy status to other drugs, medicaments and biological substances status: Secondary | ICD-10-CM | POA: Diagnosis not present

## 2022-10-18 DIAGNOSIS — N184 Chronic kidney disease, stage 4 (severe): Secondary | ICD-10-CM | POA: Diagnosis not present

## 2022-10-18 DIAGNOSIS — E785 Hyperlipidemia, unspecified: Secondary | ICD-10-CM | POA: Diagnosis not present

## 2022-10-18 DIAGNOSIS — D649 Anemia, unspecified: Secondary | ICD-10-CM | POA: Diagnosis not present

## 2022-10-18 DIAGNOSIS — J189 Pneumonia, unspecified organism: Secondary | ICD-10-CM | POA: Diagnosis not present

## 2022-10-18 DIAGNOSIS — K5900 Constipation, unspecified: Secondary | ICD-10-CM | POA: Diagnosis not present

## 2022-10-18 DIAGNOSIS — N281 Cyst of kidney, acquired: Secondary | ICD-10-CM | POA: Diagnosis not present

## 2022-10-18 DIAGNOSIS — F419 Anxiety disorder, unspecified: Secondary | ICD-10-CM | POA: Diagnosis not present

## 2022-10-18 DIAGNOSIS — R410 Disorientation, unspecified: Secondary | ICD-10-CM | POA: Diagnosis not present

## 2022-10-18 DIAGNOSIS — E8729 Other acidosis: Secondary | ICD-10-CM | POA: Diagnosis not present

## 2022-10-18 DIAGNOSIS — R001 Bradycardia, unspecified: Secondary | ICD-10-CM | POA: Diagnosis not present

## 2022-10-19 DIAGNOSIS — N179 Acute kidney failure, unspecified: Secondary | ICD-10-CM | POA: Diagnosis not present

## 2022-10-19 DIAGNOSIS — R001 Bradycardia, unspecified: Secondary | ICD-10-CM | POA: Diagnosis not present

## 2022-10-19 DIAGNOSIS — D649 Anemia, unspecified: Secondary | ICD-10-CM | POA: Diagnosis not present

## 2022-10-19 DIAGNOSIS — E871 Hypo-osmolality and hyponatremia: Secondary | ICD-10-CM | POA: Diagnosis not present

## 2022-10-19 DIAGNOSIS — E8729 Other acidosis: Secondary | ICD-10-CM | POA: Diagnosis not present

## 2023-04-10 DIAGNOSIS — I517 Cardiomegaly: Secondary | ICD-10-CM | POA: Diagnosis not present

## 2023-04-10 DIAGNOSIS — J90 Pleural effusion, not elsewhere classified: Secondary | ICD-10-CM | POA: Diagnosis not present

## 2023-04-20 ENCOUNTER — Encounter: Payer: Self-pay | Admitting: Internal Medicine

## 2023-05-04 DEATH — deceased
